# Patient Record
Sex: Male | Born: 1985 | Race: White | Hispanic: No | Marital: Married | Smoking: Never smoker
Health system: Southern US, Community
[De-identification: ages and names within clinical notes are randomized; demographics above are authoritative.]

## PROBLEM LIST (undated history)

## (undated) DIAGNOSIS — H9319 Tinnitus, unspecified ear: Secondary | ICD-10-CM

## (undated) HISTORY — DX: Tinnitus, unspecified ear: H93.19

---

## 2017-03-26 ENCOUNTER — Ambulatory Visit: Payer: Self-pay | Admitting: Physician Assistant

## 2017-04-04 ENCOUNTER — Ambulatory Visit: Payer: Self-pay | Admitting: Physician Assistant

## 2017-09-24 ENCOUNTER — Ambulatory Visit: Payer: BC Managed Care – PPO | Admitting: Physician Assistant

## 2017-09-24 ENCOUNTER — Other Ambulatory Visit: Payer: Self-pay

## 2017-09-24 ENCOUNTER — Ambulatory Visit (INDEPENDENT_AMBULATORY_CARE_PROVIDER_SITE_OTHER): Payer: BC Managed Care – PPO

## 2017-09-24 ENCOUNTER — Encounter: Payer: Self-pay | Admitting: Physician Assistant

## 2017-09-24 VITALS — BP 116/72 | HR 79 | Temp 97.7°F | Resp 16 | Ht 68.0 in | Wt 213.0 lb

## 2017-09-24 DIAGNOSIS — M25562 Pain in left knee: Secondary | ICD-10-CM

## 2017-09-24 DIAGNOSIS — G8929 Other chronic pain: Secondary | ICD-10-CM

## 2017-09-24 DIAGNOSIS — Z8739 Personal history of other diseases of the musculoskeletal system and connective tissue: Secondary | ICD-10-CM | POA: Diagnosis not present

## 2017-09-24 NOTE — Progress Notes (Signed)
Patient presents to clinic today to establish care.  Acute Concerns: Patient endorses chronic pain of L knee. Endorses having an injury to the L knee in [place] causing pain and discomfort. During assessment, patient endorses diagnosis of osgood schlatter. Notes intermittent pain in the anterior knee since this time. Denies redness, swelling, weakness. Denies instability with walking. Does occasionally note some left hip pain with prolonged standing. Has started over the past few months with intermittent aching pain of R knee. Denies swelling of extremity. Denies known trauma or injury to the R knee. 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 Does try to wear supportive footwear.  Health Maintenance: Immunizations -- Declines flu shot. Tetanus up-to-date. Due in 2024.  Past Medical History:  Diagnosis Date  . Ringing in ears    Both ears, started in 2015    History reviewed. No pertinent surgical history.  Current Outpatient Medications on File Prior to Visit  Medication Sig Dispense Refill  . MULTIPLE VITAMIN PO Take 1 tablet by mouth daily.    . Omega-3 Fatty Acids (FISH OIL) 1000 MG CAPS Take 1 capsule by mouth daily.     No current facility-administered medications on file prior to visit.     No Known Allergies  Family History  Problem Relation Age of Onset  . High blood pressure Mother   . High blood pressure Father   . Heart disease Paternal Uncle   . Diabetes Paternal Grandmother     Social History   Socioeconomic History  . Marital status: Married    Spouse name: Not on file  . Number of children: Not on file  . Years of education: Not on file  . Highest education level: Not on file  Social Needs  . Financial resource strain: Not on file  . Food insecurity - worry: Not on file  . Food insecurity - inability: Not on file  . Transportation needs - medical: Not on file  . Transportation needs - non-medical:  Not on file  Occupational History  . Not on file  Tobacco Use  . Smoking status: Never Smoker  . Smokeless tobacco: Never Used  Substance and Sexual Activity  . Alcohol use: Yes    Comment: Maybe about 3 beers per month  . Drug use: No  . Sexual activity: Yes    Birth control/protection: None  Other Topics Concern  . Not on file  Social History Narrative  . Not on file   Review of Systems  HENT: Positive for tinnitus (chronic). Negative for ear pain and hearing loss.   Eyes: Negative for blurred vision.  Cardiovascular: Negative for chest pain and palpitations.  Musculoskeletal: Positive for joint pain. Negative for back pain, falls, myalgias and neck pain.  Neurological: Negative for dizziness and loss of consciousness.  Psychiatric/Behavioral: Negative for depression. The patient is not nervous/anxious.    BP 116/72   Pulse 79   Temp 97.7 F (36.5 C) (Oral)   Resp 16   Ht 5\' 8"  (1.727 m)   Wt 213 lb (96.6 kg)   SpO2 97%   BMI 32.39 kg/m   Physical Exam  Constitutional: He is oriented to person, place, and time and well-developed, well-nourished, and in no distress.  HENT:  Head: Normocephalic and atraumatic.  Eyes: Conjunctivae are normal.  Neck: Neck supple.  Cardiovascular: Normal rate, regular rhythm, normal heart sounds and intact distal pulses.  Pulmonary/Chest: Effort normal  and breath sounds normal. No respiratory distress. He has no wheezes. He has no rales. He exhibits no tenderness.  Musculoskeletal:       Right knee: Normal. He exhibits normal range of motion, no swelling, no LCL laxity, normal patellar mobility, no bony tenderness and no MCL laxity. No tenderness found.       Left knee: He exhibits normal range of motion, no swelling, no LCL laxity, normal patellar mobility and no MCL laxity. Tenderness (tenderness over the tibial tuberosity noted) found. No medial joint line, no lateral joint line, no MCL, no LCL and no patellar tendon tenderness noted.    Neurological: He is alert and oriented to person, place, and time.  Skin: Skin is warm and dry. No rash noted.  Psychiatric: Affect normal.  Vitals reviewed.  Assessment/Plan: 1. Hx of Osgood-Schlatter disease 2. Chronic pain of left knee Tenderness of tibial tuberosity of L knee on exam. No noted swelling. No other abnormal exam findings. Will obtain imaging today. Discussed likely need for Ortho consult giving symptoms and history. Also worried that posture and weight-bearing on other joints to help with chronic L knee pain is causing discomfort in other joints.  - DG Knee Complete 4 Views Left; Future   Leeanne Rio, PA-C

## 2017-09-24 NOTE — Patient Instructions (Signed)
It was a pleasure meeting you today. Please go to the [facility] office for x-ray. I will call with results.   Please start a daily turmeric supplement (500 mg daily). Wear supportive footwear. I feel the R knee pain and occasional hip pain are due to postural changes to accommodate for the L knee.   Tylenol if needed for breakthrough pain. We may need Ortho or Physical therapy on board depending on x-ray findings.   Please schedule a complete physical at your earliest convenience.

## 2017-09-25 ENCOUNTER — Other Ambulatory Visit: Payer: Self-pay | Admitting: *Deleted

## 2017-09-25 DIAGNOSIS — G8929 Other chronic pain: Secondary | ICD-10-CM | POA: Insufficient documentation

## 2017-09-25 DIAGNOSIS — Z8739 Personal history of other diseases of the musculoskeletal system and connective tissue: Secondary | ICD-10-CM | POA: Insufficient documentation

## 2017-09-25 DIAGNOSIS — M25562 Pain in left knee: Secondary | ICD-10-CM

## 2017-11-10 ENCOUNTER — Telehealth: Payer: Self-pay | Admitting: Physician Assistant

## 2017-11-10 NOTE — Telephone Encounter (Signed)
Copied from [place] [id number]. Topic: Quick Communication - See Telephone Encounter >> Nov 10, 2017  4:23 PM Boyd Kerbs wrote: CRM for notification.   Pt would like to speak to [facility] regarding treatment that the Ortho doctor is suggesting .   See Telephone encounter for: 11/10/17.

## 2017-11-11 NOTE — Telephone Encounter (Signed)
I would recommend that he follow the orthopedics recommendations as they are the expert in this case.

## 2017-11-11 NOTE — Telephone Encounter (Signed)
Please advise from specialist records.

## 2017-11-14 NOTE — Telephone Encounter (Signed)
Advised patient of PCP recommendations. Patient was very hesitant of starting Ortho recommendations. They wanted him to take Ibuprofen 800 mg tid and use Pennsiad topical. Advised patient he can start the Ibuprofen as needed to help with the knee pain and follow up with them in 1 month. He was agreeable with starting as needed.

## 2017-11-18 ENCOUNTER — Ambulatory Visit (HOSPITAL_COMMUNITY)
Admission: EM | Admit: 2017-11-18 | Discharge: 2017-11-18 | Disposition: A | Discharge status: Home / Self Care | Payer: BC Managed Care – PPO | Attending: Family Medicine | Admitting: Family Medicine

## 2017-11-18 ENCOUNTER — Encounter (HOSPITAL_COMMUNITY): Payer: Self-pay | Admitting: Emergency Medicine

## 2017-11-18 DIAGNOSIS — J029 Acute pharyngitis, unspecified: Secondary | ICD-10-CM | POA: Insufficient documentation

## 2017-11-18 LAB — POCT RAPID STREP A: STREPTOCOCCUS, GROUP A SCREEN (DIRECT): NEGATIVE

## 2017-11-18 MED ORDER — CETIRIZINE HCL 10 MG PO TABS: 10.0000 mg | ORAL_TABLET | Freq: Every day | ORAL | 0 refills | Status: DC

## 2017-11-18 MED ORDER — TRIAMCINOLONE ACETONIDE 55 MCG/ACT NA AERO: 2.0000 | INHALATION_SPRAY | Freq: Every day | NASAL | 12 refills | Status: DC

## 2017-11-18 MED ORDER — LIDOCAINE VISCOUS 2 % MT SOLN: 15.0000 mL | OROMUCOSAL | 0 refills | Status: DC | PRN

## 2017-11-18 NOTE — ED Triage Notes (Signed)
Pt here for sore throat and painful swallowing

## 2017-11-18 NOTE — ED Provider Notes (Signed)
[place]    CSN: [id number] Arrival date & time: 11/18/17  1000     History   Chief Complaint Chief Complaint  Patient presents with  . Sore Throat    HPI Jesse Shaw is a 32 y.o. male.   32 year old male comes in for a 1 day history of sore throat.  Has also had some nasal congestion, mild cough.  Denies rhinorrhea.  Denies watery eyes, sneezing.  Denies fever, chills, night sweats.  Has been increasing fluid intake to help with sore throat.  States painful swallowing, but denies swelling of the throat, trouble breathing, tripoding, drooling.  Never smoker.     Past Medical History:  Diagnosis Date  . Ringing in ears    Both ears, started in 2015    Patient Active Problem List   Diagnosis Date Noted  . Hx of Osgood-Schlatter disease 09/25/2017  . Chronic pain of left knee 09/25/2017    History reviewed. No pertinent surgical history.     Home Medications    Prior to Admission medications   Medication Sig Start Date End Date Taking? Authorizing Provider  cetirizine (ZYRTEC) 10 MG tablet Take 1 tablet (10 mg total) by mouth daily. 11/18/17   Tasia Catchings, Tyja Gortney V, PA-C  lidocaine (XYLOCAINE) 2 % solution Use as directed 15 mLs in the mouth or throat as needed for mouth pain. 11/18/17   Tasia Catchings, Xaidyn Kepner V, PA-C  MULTIPLE VITAMIN PO Take 1 tablet by mouth daily.    [provider]  Omega-3 Fatty Acids (FISH OIL) 1000 MG CAPS Take 1 capsule by mouth daily.    [provider]  triamcinolone (NASACORT) 55 MCG/ACT AERO nasal inhaler Place 2 sprays into the nose daily. 11/18/17   Ok Edwards, PA-C    Family History Family History  Problem Relation Age of Onset  . High blood pressure Mother   . High blood pressure Father   . Heart disease Paternal Uncle   . Diabetes Paternal Grandmother   . Breast cancer Maternal Grandmother     Social History Social History   Tobacco Use  . Smoking status: Never Smoker  . Smokeless tobacco: Never Used  Substance Use  Topics  . Alcohol use: Yes    Comment: Maybe about 3 beers per month  . Drug use: No     Allergies   Patient has no known allergies.   Review of Systems Review of Systems  Reason unable to perform ROS: See HPI as above.     Physical Exam Triage Vital Signs ED Triage Vitals [11/18/17 1019]  Enc Vitals Group     BP 133/64     Pulse Rate 71     Resp 16     Temp 98.1 F (36.7 C)     Temp Source Oral     SpO2 99 %     Weight      Height      Head Circumference      Peak Flow      Pain Score      Pain Loc      Pain Edu?      Excl. in [place]?    No data found.  Updated Vital Signs BP 133/64 (BP Location: Left Arm)   Pulse 71   Temp 98.1 F (36.7 C) (Oral)   Resp 16   SpO2 99%   Physical Exam  Constitutional: He is oriented to person, place, and time. He appears well-developed and well-nourished.  Non-toxic appearance. He  does not appear ill. No distress.  Speaking in full sentences without problems  HENT:  Head: Normocephalic and atraumatic.  Right Ear: Tympanic membrane, external ear and ear canal normal. Tympanic membrane is not erythematous and not bulging.  Left Ear: Tympanic membrane, external ear and ear canal normal. Tympanic membrane is not erythematous and not bulging.  Nose: Nose normal. Right sinus exhibits no maxillary sinus tenderness and no frontal sinus tenderness. Left sinus exhibits no maxillary sinus tenderness and no frontal sinus tenderness.  Mouth/Throat: Uvula is midline, oropharynx is clear and moist and mucous membranes are normal. Tonsils are 2+ on the right. Tonsils are 2+ on the left. No tonsillar exudate.  Eyes: Pupils are equal, round, and reactive to light. Conjunctivae are normal.  Neck: Normal range of motion. Neck supple.  Cardiovascular: Normal rate, regular rhythm and normal heart sounds. Exam reveals no gallop and no friction rub.  No murmur heard. Pulmonary/Chest: Effort normal and breath sounds normal. He has no decreased breath  sounds. He has no wheezes. He has no rhonchi. He has no rales.  Lymphadenopathy:    He has no cervical adenopathy.  Neurological: He is alert and oriented to person, place, and time.  Skin: Skin is warm and dry.  Psychiatric: He has a normal mood and affect. His behavior is normal. Judgment normal.     UC Treatments / Results  Labs (all labs ordered are listed, but only abnormal results are displayed) Labs Reviewed  CULTURE, GROUP A STREP [facility])  POCT RAPID STREP A    EKG None  Radiology No results found.  Procedures Procedures (including critical care time)  Medications Ordered in UC Medications - No data to display  Initial Impression / Assessment and Plan / UC Course  I have reviewed the triage vital signs and the nursing notes.  Pertinent labs & imaging results that were available during my care of the patient were reviewed by me and considered in my medical decision making (see chart for details).    Rapid strep negative. Patient is nontoxic in appearance. Symptomatic treatment as needed. Return precautions given.   Final Clinical Impressions(s) / UC Diagnoses   Final diagnoses:  Pharyngitis, unspecified etiology     Discharge Instructions     Rapid strep negative. Symptoms are most likely due to viral illness/drainage. Start lidocaine solution to gurgle as needed for sore throat, wait 30-40 mins to eat or drink after use as it can stunt your gag reflex. Nasacort and/or Zyrtec for nasal congestion/drainage. You can use over the counter nasal saline rinse such as neti pot for nasal congestion. Monitor for any worsening of symptoms, swelling of the throat, trouble breathing, trouble swallowing, follow up for reevaluation.   For sore throat try using a honey-based tea. Use 3 teaspoons of honey with juice squeezed from half lemon. Place shaved pieces of ginger into 1/2-1 cup of water and warm over stove top. Then mix the ingredients and repeat every 4 hours as  needed.     ED Prescriptions    Medication Sig Dispense Auth. Provider   triamcinolone (NASACORT) 55 MCG/ACT AERO nasal inhaler Place 2 sprays into the nose daily. 1 Inhaler Itzael Liptak V, PA-C   cetirizine (ZYRTEC) 10 MG tablet Take 1 tablet (10 mg total) by mouth daily. 15 tablet Hally Colella V, PA-C   lidocaine (XYLOCAINE) 2 % solution Use as directed 15 mLs in the mouth or throat as needed for mouth pain. 100 mL Ok Edwards, PA-C  Ok Edwards, PA-C 11/18/17 1102

## 2017-11-18 NOTE — Discharge Instructions (Signed)
Rapid strep negative. Symptoms are most likely due to viral illness/drainage. Start lidocaine solution to gurgle as needed for sore throat, wait 30-40 mins to eat or drink after use as it can stunt your gag reflex. Nasacort and/or Zyrtec for nasal congestion/drainage. You can use over the counter nasal saline rinse such as neti pot for nasal congestion. Monitor for any worsening of symptoms, swelling of the throat, trouble breathing, trouble swallowing, follow up for reevaluation.   For sore throat try using a honey-based tea. Use 3 teaspoons of honey with juice squeezed from half lemon. Place shaved pieces of ginger into 1/2-1 cup of water and warm over stove top. Then mix the ingredients and repeat every 4 hours as needed.

## 2017-11-20 LAB — CULTURE, GROUP A STREP (THRC)

## 2018-01-13 ENCOUNTER — Other Ambulatory Visit: Payer: Self-pay

## 2018-01-13 ENCOUNTER — Encounter: Payer: Self-pay | Admitting: Physician Assistant

## 2018-01-13 ENCOUNTER — Ambulatory Visit: Payer: BC Managed Care – PPO | Admitting: Physician Assistant

## 2018-01-13 VITALS — BP 110/70 | HR 76 | Temp 98.3°F | Resp 14 | Ht 68.0 in | Wt 208.0 lb

## 2018-01-13 DIAGNOSIS — S46812A Strain of other muscles, fascia and tendons at shoulder and upper arm level, left arm, initial encounter: Secondary | ICD-10-CM | POA: Diagnosis not present

## 2018-01-13 DIAGNOSIS — M5412 Radiculopathy, cervical region: Secondary | ICD-10-CM | POA: Diagnosis not present

## 2018-01-13 MED ORDER — METHYLPREDNISOLONE 4 MG PO TBPK: ORAL_TABLET | ORAL | 0 refills | Status: DC

## 2018-01-13 MED ORDER — CYCLOBENZAPRINE HCL 10 MG PO TABS: 10.0000 mg | ORAL_TABLET | Freq: Three times a day (TID) | ORAL | 0 refills | Status: DC | PRN

## 2018-01-13 NOTE — Progress Notes (Signed)
Patient presents to clinic today c/o pain in L shoulder/neck with radiation into LUE with some numbness and tingling. 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  Notes tightness in back and L shoulder with some mild weakness in his arm. Denies decreased ROM. Denies skin changes. Has taken Ibuprofen with little relief in symptoms.    Past Medical History:  Diagnosis Date  . Ringing in ears    Both ears, started in 2015    Current Outpatient Medications on File Prior to Visit  Medication Sig Dispense Refill  . MULTIPLE VITAMIN PO Take 1 tablet by mouth daily.    . Omega-3 Fatty Acids (FISH OIL) 1000 MG CAPS Take 1 capsule by mouth daily.    . Turmeric 400 MG CAPS Take 1 capsule by mouth daily.     No current facility-administered medications on file prior to visit.     No Known Allergies  Family History  Problem Relation Age of Onset  . High blood pressure Mother   . High blood pressure Father   . Heart disease Paternal Uncle   . Diabetes Paternal Grandmother   . Breast cancer Maternal Grandmother     Social History   Socioeconomic History  . Marital status: Married    Spouse name: Not on file  . Number of children: Not on file  . Years of education: Not on file  . Highest education level: Not on file  Occupational History  . Not on file  Social Needs  . Financial resource strain: Not on file  . Food insecurity:    Worry: Not on file    Inability: Not on file  . Transportation needs:    Medical: Not on file    Non-medical: Not on file  Tobacco Use  . Smoking status: Never Smoker  . Smokeless tobacco: Never Used  Substance and Sexual Activity  . Alcohol use: Yes    Comment: Maybe about 3 beers per month  . Drug use: No  . Sexual activity: Yes    Birth control/protection: None  Lifestyle  . Physical activity:    Days per week: Not on file    Minutes per session: Not on file  . Stress: Not on file  Relationships  . Social connections:    Talks on phone: Not on  file    Gets together: Not on file    Attends religious service: Not on file    Active member of club or organization: Not on file    Attends meetings of clubs or organizations: Not on file    Relationship status: Not on file  Other Topics Concern  . Not on file  Social History Narrative  . Not on file   Review of Systems - See HPI.  All other ROS are negative.  BP 110/70   Pulse 76   Temp 98.3 F (36.8 C) (Oral)   Resp 14   Ht 5\' 8"  (1.727 m)   Wt 208 lb (94.3 kg)   SpO2 98%   BMI 31.63 kg/m   Physical Exam  Constitutional: He appears well-developed and well-nourished.  Cardiovascular: Normal rate, regular rhythm and normal heart sounds.  Pulmonary/Chest: Effort normal and breath sounds normal.  Musculoskeletal:       Left shoulder: Normal.       Left elbow: Normal.       Cervical back: He exhibits tenderness, pain and spasm. He exhibits no bony tenderness.       Back:  Vitals reviewed.  Recent Results (  from the past 2160 hour(s))  POCT rapid strep A [facility])     Status: None   Collection Time: 11/18/17 10:29 AM  Result Value Ref Range   Streptococcus, Group A Screen (Direct) NEGATIVE NEGATIVE  Culture, group A strep     Status: None   Collection Time: 11/18/17 10:31 AM  Result Value Ref Range   Specimen Description THROAT    Special Requests NONE    Culture      NO GROUP A STREP (S.PYOGENES) ISOLATED Performed at [facility], [address], [place], [place] [address]    Report Status 11/20/2017 FINAL    Assessment/Plan: 1. Strain of left trapezius muscle, initial encounter Start Flexeril in the evening. Limit heavy lifting and over exertion. Start Medrol dose pack. Follow-up if not resolving.  - cyclobenzaprine (FLEXERIL) 10 MG tablet; Take 1 tablet (10 mg total) by mouth 3 (three) times daily as needed for muscle spasms.  Dispense: 30 tablet; Refill: 0  2. Cervical radicular pain Start medrol dose pack to calm nerve inflammation. Supportive  measures and OTC medications reviewed.  - methylPREDNISolone (MEDROL DOSEPAK) 4 MG TBPK tablet; Take following package directions.  Dispense: 21 tablet; Refill: 0   Leeanne Rio, PA-C

## 2018-01-13 NOTE — Patient Instructions (Signed)
Please keep well-hydrated and get plenty of rest. No jujitsu until symptoms are resolved.  Can to light stretches.  No heavy lifting or overhead lifting with the left shoulder.  Apply heating pad to the neck and trapezius for 10 minutes a few times per day.  Take the Medrol pack as directed. Use tylenol for back pain. Use the Flexeril in the evening.  Follow-up if symptoms are not resolving.

## 2018-03-16 ENCOUNTER — Encounter: Payer: Self-pay | Admitting: Physician Assistant

## 2018-03-16 ENCOUNTER — Other Ambulatory Visit: Payer: Self-pay

## 2018-03-16 ENCOUNTER — Ambulatory Visit (INDEPENDENT_AMBULATORY_CARE_PROVIDER_SITE_OTHER): Payer: BC Managed Care – PPO | Admitting: Physician Assistant

## 2018-03-16 VITALS — BP 118/84 | HR 65 | Temp 98.4°F | Resp 16 | Ht 68.0 in | Wt 206.4 lb

## 2018-03-16 DIAGNOSIS — D229 Melanocytic nevi, unspecified: Secondary | ICD-10-CM

## 2018-03-16 NOTE — Progress Notes (Signed)
Patient presents to clinic today c/o numerous moles of skin that he has had since a young age. Notes his wife wanted them assessed to make sure there are no concerning findings.    Past Medical History:  Diagnosis Date  . Ringing in ears    Both ears, started in 2015    Current Outpatient Medications on File Prior to Visit  Medication Sig Dispense Refill  . MULTIPLE VITAMIN PO Take 1 tablet by mouth daily.    . Omega-3 Fatty Acids (FISH OIL) 1000 MG CAPS Take 1 capsule by mouth daily.    Marland Kitchen triamcinolone (NASACORT) 55 MCG/ACT AERO nasal inhaler triamcinolone acetonide 55 mcg nasal spray aerosol  PLACE 2 SPRAYS INTO THE NOSE DAILY.    . Turmeric 400 MG CAPS Take 1 capsule by mouth daily.    . cyclobenzaprine (FLEXERIL) 10 MG tablet Take 1 tablet (10 mg total) by mouth 3 (three) times daily as needed for muscle spasms. (Patient not taking: Reported on 03/16/2018) 30 tablet 0  . Diclofenac Sodium (PENNSAID) 2 % SOLN Pennsaid 20 mg/gram/actuation (2 %) topical soln in metered-dose pump  APPLY 2 PUMPS (40 MG) TO THE AFFECTED KNEE(S) BY TOPICAL ROUTE 2 TIMES PER DAY    . lidocaine (XYLOCAINE) 2 % solution Lidocaine Viscous 2 % mucosal solution  USE AS DIRECTED 15 MLS IN THE MOUTH OR THROAT AS NEEDED FOR MOUTH PAIN.     No current facility-administered medications on file prior to visit.     No Known Allergies  Family History  Problem Relation Age of Onset  . High blood pressure Mother   . High blood pressure Father   . Heart disease Paternal Uncle   . Diabetes Paternal Grandmother   . Breast cancer Maternal Grandmother     Social History   Socioeconomic History  . Marital status: Married    Spouse name: Not on file  . Number of children: Not on file  . Years of education: Not on file  . Highest education level: Not on file  Occupational History  . Not on file  Social Needs  . Financial resource strain: Not on file  . Food insecurity:    Worry: Not on file    Inability:  Not on file  . Transportation needs:    Medical: Not on file    Non-medical: Not on file  Tobacco Use  . Smoking status: Never Smoker  . Smokeless tobacco: Never Used  Substance and Sexual Activity  . Alcohol use: Yes    Comment: Maybe about 3 beers per month  . Drug use: No  . Sexual activity: Yes    Birth control/protection: None  Lifestyle  . Physical activity:    Days per week: Not on file    Minutes per session: Not on file  . Stress: Not on file  Relationships  . Social connections:    Talks on phone: Not on file    Gets together: Not on file    Attends religious service: Not on file    Active member of club or organization: Not on file    Attends meetings of clubs or organizations: Not on file    Relationship status: Not on file  Other Topics Concern  . Not on file  Social History Narrative  . Not on file   Review of Systems - See HPI.  All other ROS are negative.  Ht 5\' 8"  (1.727 m)   BMI 31.63 kg/m   Physical Exam  Constitutional:  He is oriented to person, place, and time. He appears well-developed and well-nourished.  Cardiovascular: Normal rate, regular rhythm, normal heart sounds and intact distal pulses.  Pulmonary/Chest: Effort normal.  Neurological: He is alert and oriented to person, place, and time.  Skin:     Psychiatric: He has a normal mood and affect.  Vitals reviewed.   Assessment/Plan: 1. Multiple nevi Referral to dermatology placed for further assessment and management giving multiple nevi and 1 with some atypical characteristics. Skin care discussed.   - Ambulatory referral to Dermatology   Leeanne Rio, PA-C

## 2018-03-16 NOTE — Patient Instructions (Signed)
I am placing a referral for you to Dermatology -- Dr. Nevada Crane in [place].  Make sure to always wear sunscreen -- applying to face, neck and ears -- when you are going to be outside for > 20 minutes.  Wear a hat when possible. 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   Nothing looks overly concerning today which is good. We will have the Dermatologist do a head to toe skin examination to verify.

## 2018-09-04 ENCOUNTER — Ambulatory Visit: Payer: BC Managed Care – PPO | Admitting: Physician Assistant

## 2018-09-07 ENCOUNTER — Ambulatory Visit: Payer: BC Managed Care – PPO | Admitting: Physician Assistant

## 2018-11-16 ENCOUNTER — Encounter: Payer: Self-pay | Admitting: Emergency Medicine

## 2018-11-23 ENCOUNTER — Encounter: Payer: Self-pay | Admitting: Physician Assistant

## 2018-11-23 ENCOUNTER — Ambulatory Visit (INDEPENDENT_AMBULATORY_CARE_PROVIDER_SITE_OTHER): Payer: BC Managed Care – PPO | Admitting: Physician Assistant

## 2018-11-23 ENCOUNTER — Other Ambulatory Visit: Payer: Self-pay

## 2018-11-23 ENCOUNTER — Ambulatory Visit: Payer: Self-pay | Admitting: Physician Assistant

## 2018-11-23 VITALS — Temp 98.3°F | Ht 68.0 in | Wt 208.0 lb

## 2018-11-23 DIAGNOSIS — Z20822 Contact with and (suspected) exposure to covid-19: Secondary | ICD-10-CM

## 2018-11-23 DIAGNOSIS — R6889 Other general symptoms and signs: Secondary | ICD-10-CM | POA: Diagnosis not present

## 2018-11-23 MED ORDER — ALBUTEROL SULFATE HFA 108 (90 BASE) MCG/ACT IN AERS
2.0000 | INHALATION_SPRAY | Freq: Four times a day (QID) | RESPIRATORY_TRACT | 0 refills | Status: DC | PRN
Start: 2018-11-23 — End: 2019-07-22

## 2018-11-23 NOTE — Patient Instructions (Signed)
Instructions sent to MyChart.   Please keep well-hydrated and get plenty of rest.  Delsym or Mucinex for any significant cough. Use the albuterol inhaler as directed if needed for chest tightness, shortness of breath. I have sent a stay at home note to your MyChart. Check with your work about their capability of testing. Go to the ER for any worsening of symptoms.   Individuals who are confirmed to have, or are being evaluated for, COVID-19 should follow the prevention steps below until a healthcare provider or local or state health department says they can return to normal activities.  Stay home except to get medical care You should restrict activities outside your home, except for getting medical care. Do not go to work, school, or public areas, and do not use public transportation or taxis.  Call ahead before visiting your doctor Before your medical appointment, call the healthcare provider and tell them that you have, or are being evaluated for, COVID-19 infection. This will help the healthcare provider's office take steps to keep other people from getting infected. Ask your healthcare provider to call the local or state health department.  Monitor your symptoms Seek prompt medical attention if your illness is worsening (e.g., difficulty breathing). Before going to your medical appointment, call the healthcare provider and tell them that you have, or are being evaluated for, COVID-19 infection. Ask your healthcare provider to call the local or state health department.  Wear a facemask You should wear a facemask that covers your nose and mouth when you are in the same room with other people and when you visit a healthcare provider. People who live with or visit you should also wear a facemask while they are in the same room with you.  Separate yourself from other people in your home As much as possible, you should stay in a different room from other people in your home. Also, you  should use a separate bathroom, if available.  Avoid sharing household items You should not share dishes, drinking glasses, cups, eating utensils, towels, bedding, or other items with other people in your home. After using these items, you should wash them thoroughly with soap and water.  Cover your coughs and sneezes Cover your mouth and nose with a tissue when you cough or sneeze, or you can cough or sneeze into your sleeve. Throw used tissues in a lined trash can, and immediately wash your hands with soap and water for at least 20 seconds or use an alcohol-based hand rub.  Wash your [facility] your hands often and thoroughly with soap and water for at least 20 seconds. You can use an alcohol-based hand sanitizer if soap and water are not available and if your hands are not visibly dirty. Avoid touching your eyes, nose, and mouth with unwashed hands.   Prevention Steps for Caregivers and Household Members of Individuals Confirmed to have, or Being Evaluated for, COVID-19 Infection Being Cared for in the Home  If you live with, or provide care at home for, a person confirmed to have, or being evaluated for, COVID-19 infection please follow these guidelines to prevent infection:  Follow healthcare provider's instructions Make sure that you understand and can help the patient follow any healthcare provider instructions for all care.  Provide for the patient's basic needs You should help the patient with basic needs in the home and provide support for getting groceries, prescriptions, and other personal needs.  Monitor the patient's symptoms If they are getting sicker, call his  or her medical provider and tell them that the patient has, or is being evaluated for, COVID-19 infection. This will help the healthcare provider's office take steps to keep other people from getting infected. Ask the healthcare provider to call the local or state health department.  Limit the number of  people who have contact with the patient  If possible, have only one caregiver for the patient.  Other household members should stay in another home or place of residence. If this is not possible, they should stay  in another room, or be separated from the patient as much as possible. Use a separate bathroom, if available.  Restrict visitors who do not have an essential need to be in the home.  Keep older adults, very young children, and other sick people away from the patient Keep older adults, very young children, and those who have compromised immune systems or chronic health conditions away from the patient. This includes people with chronic heart, lung, or kidney conditions, diabetes, and cancer.  Ensure good ventilation Make sure that shared spaces in the home have good air flow, such as from an air conditioner or an opened window, weather permitting.  Wash your hands often  Wash your hands often and thoroughly with soap and water for at least 20 seconds. You can use an alcohol based hand sanitizer if soap and water are not available and if your hands are not visibly dirty.  Avoid touching your eyes, nose, and mouth with unwashed hands.  Use disposable paper towels to dry your hands. If not available, use dedicated cloth towels and replace them when they become wet.  Wear a facemask and gloves  Wear a disposable facemask at all times in the room and gloves when you touch or have contact with the patient's blood, body fluids, and/or secretions or excretions, such as sweat, saliva, sputum, nasal mucus, vomit, urine, or feces.  Ensure the mask fits over your nose and mouth tightly, and do not touch it during use.  Throw out disposable facemasks and gloves after using them. Do not reuse.  Wash your hands immediately after removing your facemask and gloves.  If your personal clothing becomes contaminated, carefully remove clothing and launder. Wash your hands after handling  contaminated clothing.  Place all used disposable facemasks, gloves, and other waste in a lined container before disposing them with other household waste.  Remove gloves and wash your hands immediately after handling these items.  Do not share dishes, glasses, or other household items with the patient  Avoid sharing household items. You should not share dishes, drinking glasses, cups, eating utensils, towels, bedding, or other items with a patient who is confirmed to have, or being evaluated for, COVID-19 infection.  After the person uses these items, you should wash them thoroughly with soap and water.  Wash laundry thoroughly  Immediately remove and wash clothes or bedding that have blood, body fluids, and/or secretions or excretions, such as sweat, saliva, sputum, nasal mucus, vomit, urine, or feces, on them.  Wear gloves when handling laundry from the patient.  Read and follow directions on labels of laundry or clothing items and detergent. In general, wash and dry with the warmest temperatures recommended on the label.  Clean all areas the individual has used often  Clean all touchable surfaces, such as counters, tabletops, doorknobs, bathroom fixtures, toilets, phones, keyboards, tablets, and bedside tables, every day. Also, clean any surfaces that may have blood, body fluids, and/or secretions or excretions on them.  Wear gloves when cleaning surfaces the patient has come in contact with.  Use a diluted bleach solution (e.g., dilute bleach with 1 part bleach and 10 parts water) or a household disinfectant with a label that says EPA-registered for coronaviruses. To make a bleach solution at home, add 1 tablespoon of bleach to 1 quart (4 cups) of water. For a larger supply, add  cup of bleach to 1 gallon (16 cups) of water.  Read labels of cleaning products and follow recommendations provided on product labels. Labels contain instructions for safe and effective use of the cleaning  product including precautions you should take when applying the product, such as wearing gloves or eye protection and making sure you have good ventilation during use of the product.  Remove gloves and wash hands immediately after cleaning.  Monitor yourself for signs and symptoms of illness Caregivers and household members are considered close contacts, should monitor their health, and will be asked to limit movement outside of the home to the extent possible. Follow the monitoring steps for close contacts listed on the symptom monitoring form.   ? If you have additional questions, contact your local health department or call the epidemiologist on call at [phone] (available 24/7). ? This guidance is subject to change. For the most up-to-date guidance from [facility], please refer to their website: [email or web address]

## 2018-11-23 NOTE — Progress Notes (Signed)
I have discussed the procedure for the virtual visit with the patient who has given consent to proceed with assessment and treatment.   Linsie Lupo S Yvone Slape, CMA     

## 2018-11-23 NOTE — Telephone Encounter (Signed)
Virtual Appointment has been scheduled with PCP today.

## 2018-11-23 NOTE — Progress Notes (Signed)
   Virtual Visit via Video   I connected with patient on 11/23/18 at 10:40 AM EDT by a video enabled telemedicine application and verified that I am speaking with the correct person using two identifiers.  Location patient: Home Location provider: [facility] Office Persons participating in the virtual visit: Patient, Provider, [place] (Patina Moore)  I discussed the limitations of evaluation and management by telemedicine and the availability of in person appointments. The patient expressed understanding and agreed to proceed.  Subjective:   HPI:   Patient presents via [employer] today for potential COVID19. Patient endorses symptoms starting 5 days ago.  Endorses mild, dry cough with shortness of breath.  Notes shortness of breath has worsened since onset.  Denies wheezing but endorses feeling that he is not getting enough air.  Does note fatigue.  Unsure of fever.  Denies aches or chills.  Notes some nasal congestion without sinus pressure or sinus pain.  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   ROS:   See pertinent positives and negatives per HPI.  Patient Active Problem List   Diagnosis Date Noted  . Hx of Osgood-Schlatter disease 09/25/2017  . Chronic pain of left knee 09/25/2017    Social History   Tobacco Use  . Smoking status: Never Smoker  . Smokeless tobacco: Never Used  Substance Use Topics  . Alcohol use: Yes    Comment: Maybe about 3 beers per month    Current Outpatient Medications:  Marland Kitchen  MULTIPLE VITAMIN PO, Take 1 tablet by mouth daily., Disp: , Rfl:  .  Omega-3 Fatty Acids (FISH OIL) 1000 MG CAPS, Take 1 capsule by mouth daily., Disp: , Rfl:  .  Diclofenac Sodium (PENNSAID) 2 % SOLN, Pennsaid 20 mg/gram/actuation (2 %) topical soln in metered-dose pump  APPLY 2 PUMPS (40 MG) TO THE AFFECTED KNEE(S) BY TOPICAL ROUTE 2 TIMES PER DAY, Disp: , Rfl:  .  triamcinolone (NASACORT) 55 MCG/ACT AERO  nasal inhaler, triamcinolone acetonide 55 mcg nasal spray aerosol  PLACE 2 SPRAYS INTO THE NOSE DAILY., Disp: , Rfl:  .  Turmeric 400 MG CAPS, Take 1 capsule by mouth daily., Disp: , Rfl:   No Known Allergies  Objective:   Temp 98.3 F (36.8 C) (Oral)   Ht 5\' 8"  (1.727 m)   Wt 208 lb (94.3 kg)   BMI 31.63 kg/m   Patient is well-developed, well-nourished in no acute distress.  Resting comfortably in chair at home.  Head is normocephalic, atraumatic.  No labored breathing.  Speech is clear and coherent with logical contest.  Patient is alert and oriented at baseline.   Assessment and Plan:   1. Suspected Covid-19 Virus Infection Mild symptoms. No alarm signs/symptoms. Supportive measures and OTC medications reviewed. Rx Albuterol HFA to help with chest tightness and windedness. Isolation precautions reviewed. ER precautions reviewed. Patient placed in our COVID/temp monitoring program.  - albuterol (VENTOLIN HFA) 108 (90 Base) MCG/ACT inhaler; Inhale 2 puffs into the lungs every 6 (six) hours as needed for wheezing or shortness of breath.  Dispense: 1 Inhaler; Refill: 0 - MyChart COVID-19 home monitoring program; Future - Temperature monitoring; Future   Leeanne Rio, PA-C 11/23/2018

## 2018-11-23 NOTE — Telephone Encounter (Signed)
I wanted to know if I could be tested for the COVID-19 virus.   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  See triage notes. He denies any chest pain/discomfort with the shortness of breath.  I connected his call to Levada Dy at the [facility] office to be scheduled for a virtual visit.  I sent my notes to the office.    Reason for Disposition . [1] MILD difficulty breathing (e.g., minimal/no SOB at rest, SOB with walking, pulse <100) AND [2] NEW-onset or WORSE than normal  Answer Assessment - Initial Assessment Questions 1. RESPIRATORY STATUS: "Describe your breathing?" (e.g., wheezing, shortness of breath, unable to speak, severe coughing)      I'm feeling winded.   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   This weekend I'm noticing the shortness of breath is worse.   That's not like me. 2. ONSET: "When did this breathing problem begin?"      Wednesday of last week. 3. PATTERN "Does the difficult breathing come and go, or has it been constant since it started?"      Mostly with activity or wearing a mask. 4. SEVERITY: "How bad is your breathing?" (e.g., mild, moderate, severe)    - MILD: No SOB at rest, mild SOB with walking, speaks normally in sentences, can lay down, no retractions, pulse < 100.    - MODERATE: SOB at rest, SOB with minimal exertion and prefers to sit, cannot lie down flat, speaks in phrases, mild retractions, audible wheezing, pulse 100-120.    - SEVERE: Very SOB at rest, speaks in single words, struggling to breathe, sitting hunched forward, retractions, pulse > 120      If I'm going up the stairs I notice it more.   It's not so bad when I'm sitting. 5. RECURRENT SYMPTOM: "Have you had difficulty breathing before?" If so, ask: "When was the last time?" and  "What happened that time?"      No 6. CARDIAC HISTORY: "Do you have any history of heart disease?" (e.g., heart attack, angina, bypass surgery, angioplasty)      No 7. LUNG HISTORY: "Do you have any history of lung disease?"  (e.g., pulmonary embolus, asthma, emphysema)     No 8. CAUSE: "What do you think is causing the breathing problem?"      I've been exposed to the coronavirus. 9. OTHER SYMPTOMS: "Do you have any other symptoms? (e.g., dizziness, runny nose, cough, chest pain, fever)     No fever, a little coughing but it's very little, no chills or body aches, no change in taste or smell.  My throat is achy started about Wednesday also.   Fatigue has not been a problem mostly the shortness of breath.   Having nasal congestion. 10. PREGNANCY: "Is there any chance you are pregnant?" "When was your last menstrual period?"       N/A 11. TRAVEL: "Have you traveled out of the country in the last month?" (e.g., travel history, exposures)       *No Answer*  Protocols used: BREATHING DIFFICULTY-A-AH

## 2018-11-24 ENCOUNTER — Encounter (INDEPENDENT_AMBULATORY_CARE_PROVIDER_SITE_OTHER): Payer: Self-pay

## 2018-11-24 ENCOUNTER — Telehealth: Payer: Self-pay | Admitting: *Deleted

## 2018-11-24 NOTE — Telephone Encounter (Signed)
FYI

## 2018-11-24 NOTE — Telephone Encounter (Signed)
Patient called after receiving BPA for new onset diarrhea via MyChart Covid-19 conditioning monitoring. Patient stated that he actually had diarrhea on yesterday but has not experienced any today. Pt states that on yesterday he had 3 episodes of diarrhea. Pt states he has been able to tolerate fluids and food. Pt encouraged to continue to to drink fluids and to eat bland foods. Pt advised to avoid alcohol, spicy foods, caffeine or fatty foods that could make diarrhea worse. Pt advised that if diarrhea returns he could try OTC medications. Pt advised to notify PCP with worsening symptoms. Pt verbalized and understanding. No additional concerns voiced at this time.

## 2018-11-24 NOTE — Telephone Encounter (Signed)
Noted! Thank you

## 2018-11-25 ENCOUNTER — Encounter (INDEPENDENT_AMBULATORY_CARE_PROVIDER_SITE_OTHER): Payer: Self-pay

## 2018-11-26 ENCOUNTER — Encounter (INDEPENDENT_AMBULATORY_CARE_PROVIDER_SITE_OTHER): Payer: Self-pay

## 2018-11-29 ENCOUNTER — Encounter (INDEPENDENT_AMBULATORY_CARE_PROVIDER_SITE_OTHER): Payer: Self-pay

## 2018-12-01 ENCOUNTER — Encounter: Payer: Self-pay | Admitting: Physician Assistant

## 2018-12-01 ENCOUNTER — Encounter (INDEPENDENT_AMBULATORY_CARE_PROVIDER_SITE_OTHER): Payer: Self-pay

## 2019-02-21 IMAGING — DX DG KNEE COMPLETE 4+V*L*
4 series · 4 of 4 positions shown · non-contrast
Comparison: None.

CLINICAL DATA: Chronic left knee pain. Worsening chronic left knee
pain. History of Osgood-Schlatter.

EXAM:
LEFT KNEE - COMPLETE 4+ VIEW

[knee standing ap]
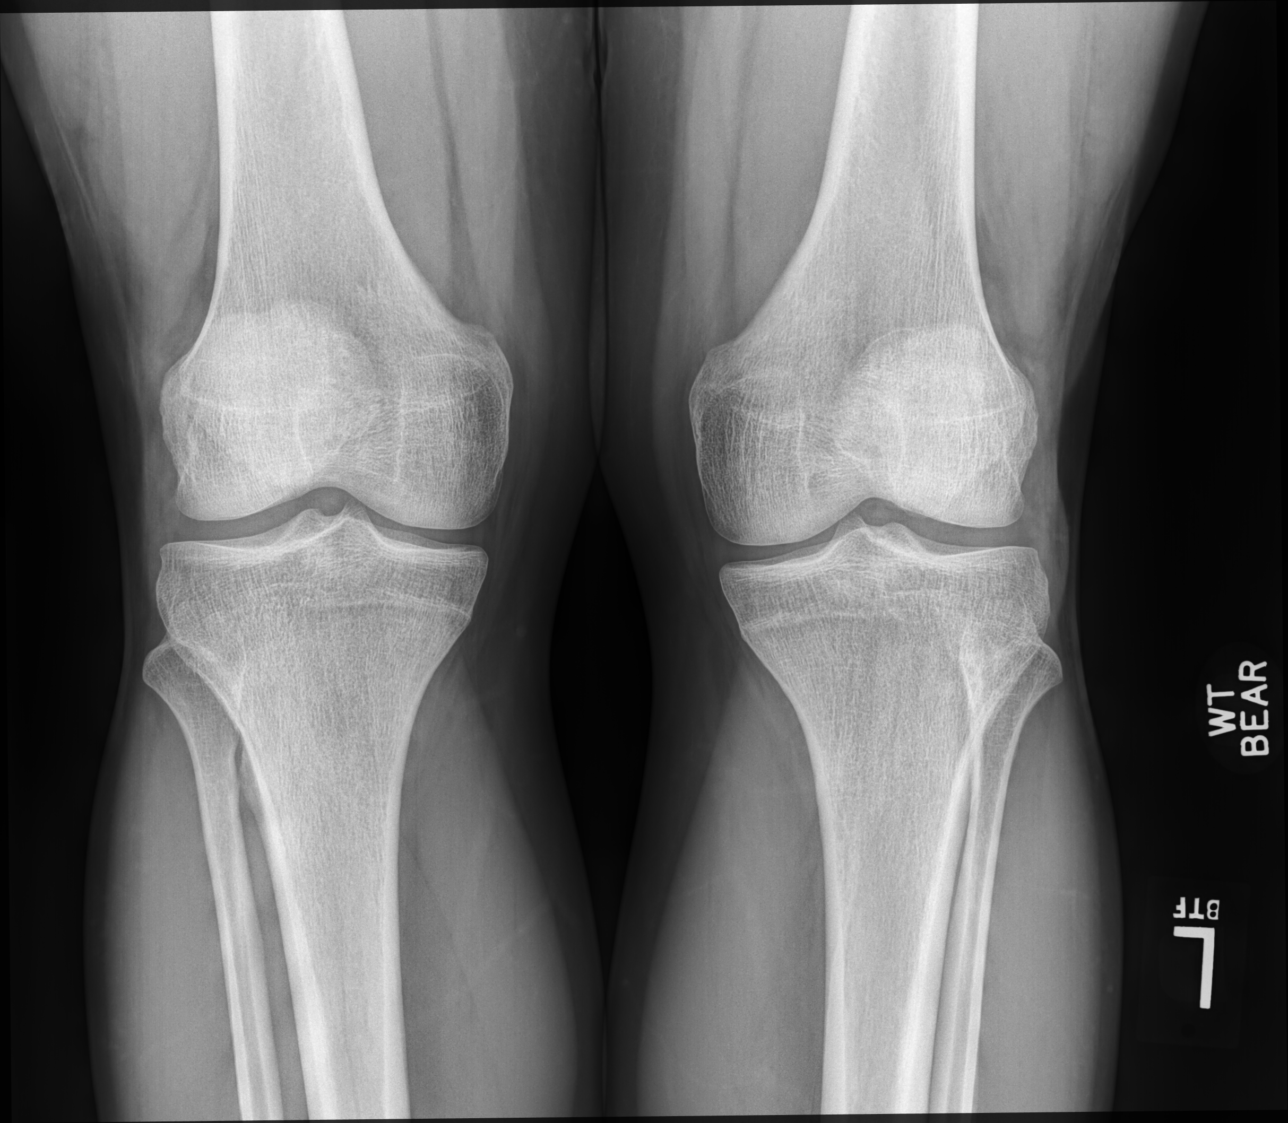

[knee standing lat]
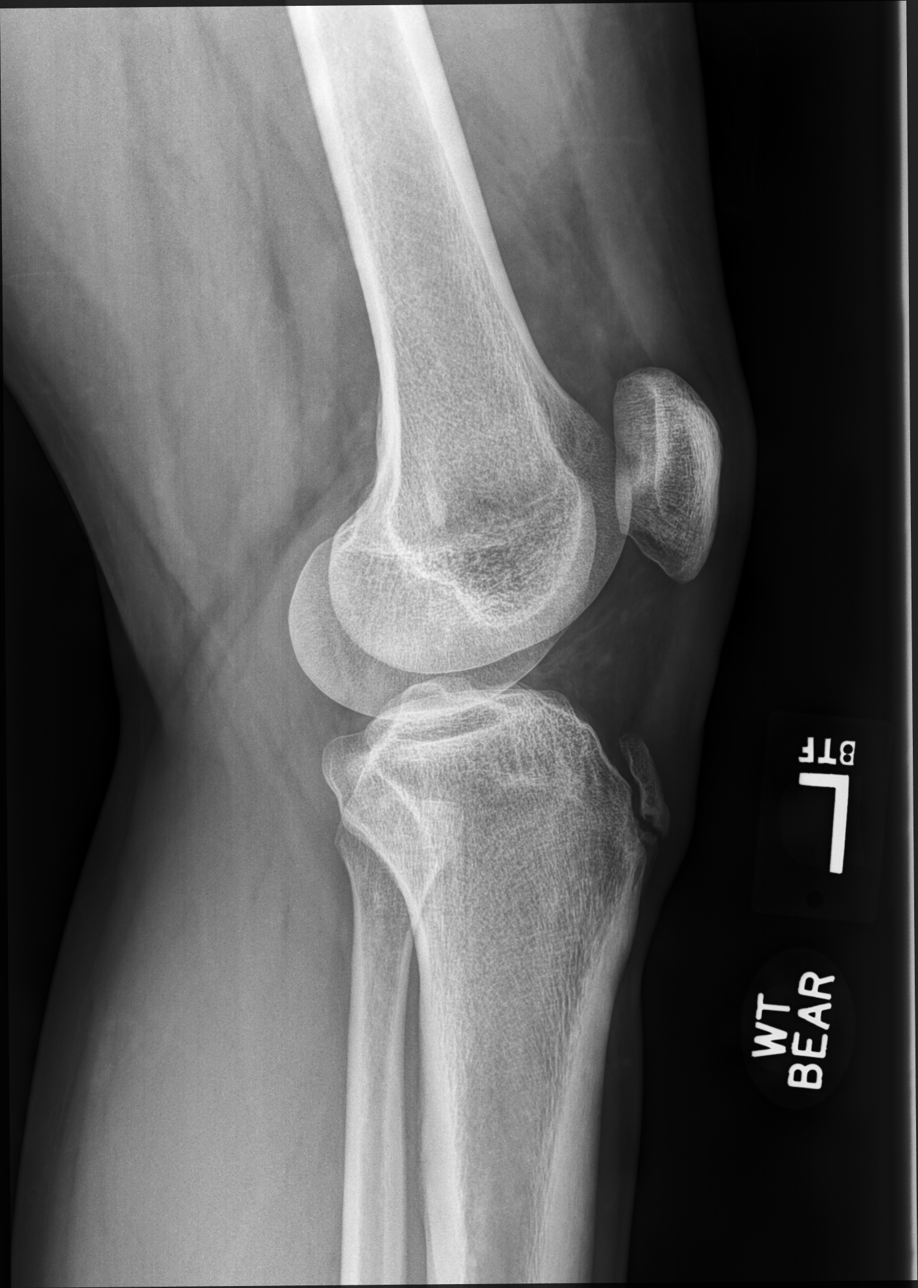

[humerus lat]
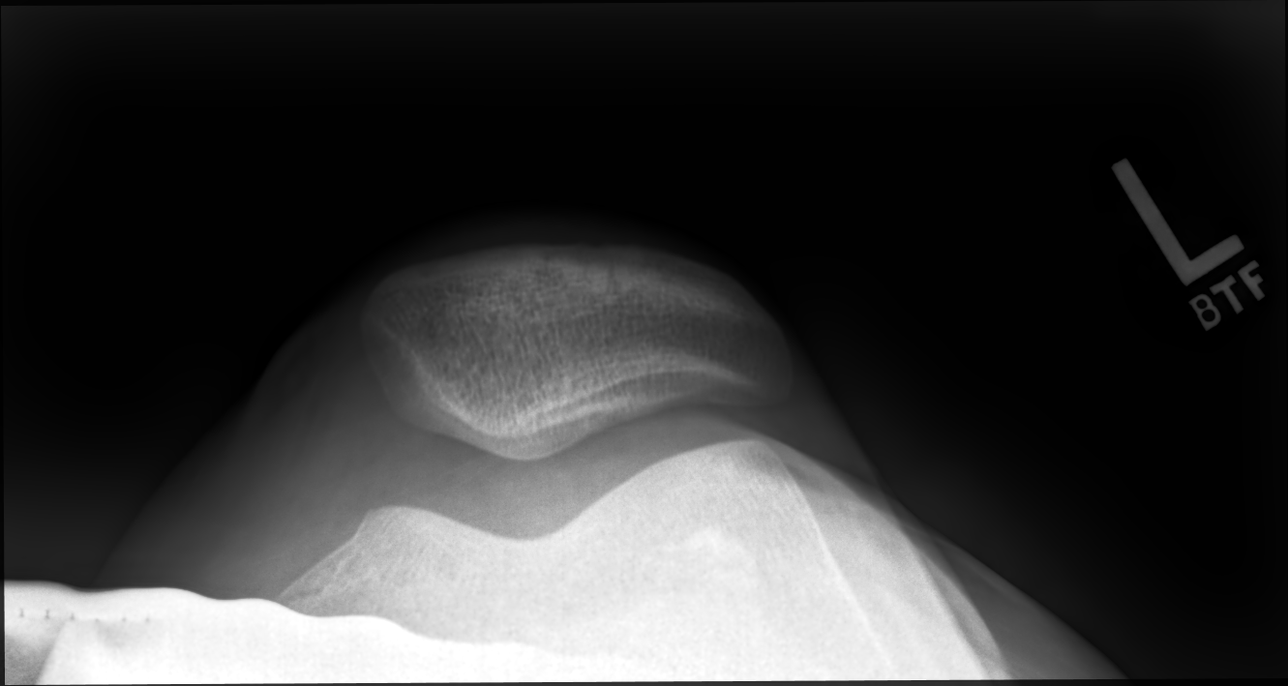

[patella lat]
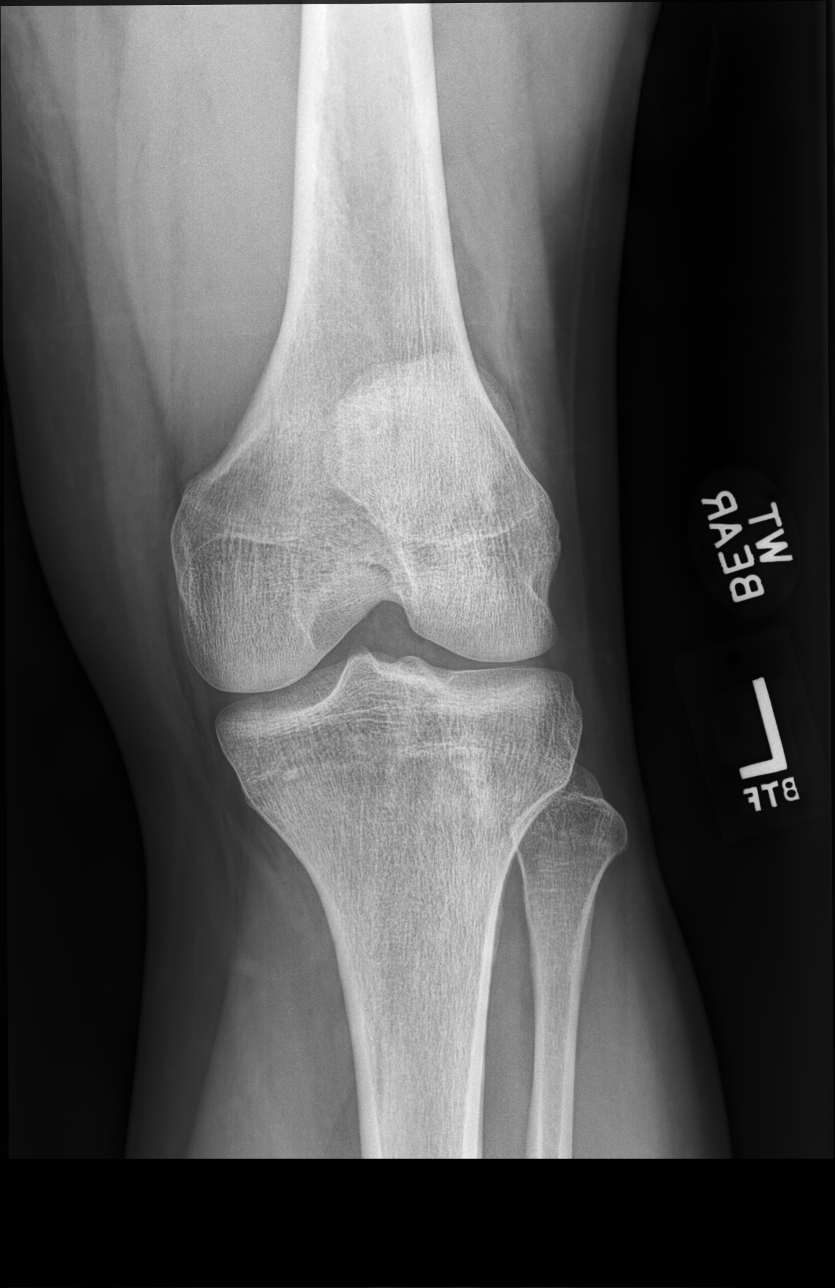

[4 of 4 positions shown; findings below may reference images not displayed]

FINDINGS: Standing AP view both knees, standing lateral, standing tunnel view
and sunrise views submitted. The alignment and joint spaces are
maintained. Well-defined osseous density adjacent to the anterior
tibial tubercle with minimal soft tissue prominence. No fracture,
bony destructive change or periosteal reaction. No focal bone
lesion. No joint effusion.
IMPRESSION: Well-defined osseous density adjacent to the anterior tibial
tubercle consistent with history of Osgood-Schlatter. There is
minimal associated soft tissue prominence that can be seen with
acute inflammation.

Otherwise unremarkable radiographs of the left knee.

## 2019-05-17 ENCOUNTER — Ambulatory Visit: Payer: BC Managed Care – PPO | Admitting: Physician Assistant

## 2019-05-17 ENCOUNTER — Encounter: Payer: Self-pay | Admitting: Physician Assistant

## 2019-05-17 ENCOUNTER — Other Ambulatory Visit: Payer: Self-pay

## 2019-05-17 VITALS — BP 120/82 | HR 87 | Temp 97.5°F | Resp 14 | Ht 68.0 in | Wt 218.0 lb

## 2019-05-17 DIAGNOSIS — K5790 Diverticulosis of intestine, part unspecified, without perforation or abscess without bleeding: Secondary | ICD-10-CM

## 2019-05-17 LAB — CBC WITH DIFFERENTIAL/PLATELET
Basophils Absolute: 0 10*3/uL (ref 0.0–0.1)
Basophils Relative: 0.3 % (ref 0.0–3.0)
Eosinophils Absolute: 0.1 10*3/uL (ref 0.0–0.7)
Eosinophils Relative: 2.6 % (ref 0.0–5.0)
HCT: 44.9 % (ref 39.0–52.0)
Hemoglobin: 15.2 g/dL (ref 13.0–17.0)
Lymphocytes Relative: 29 % (ref 12.0–46.0)
Lymphs Abs: 1.5 10*3/uL (ref 0.7–4.0)
MCHC: 33.9 g/dL (ref 30.0–36.0)
MCV: 93.3 fl (ref 78.0–100.0)
Monocytes Absolute: 0.3 10*3/uL (ref 0.1–1.0)
Monocytes Relative: 5.9 % (ref 3.0–12.0)
Neutro Abs: 3.3 10*3/uL (ref 1.4–7.7)
Neutrophils Relative %: 62.2 % (ref 43.0–77.0)
Platelets: 190 10*3/uL (ref 150.0–400.0)
RBC: 4.8 Mil/uL (ref 4.22–5.81)
RDW: 12.9 % (ref 11.5–15.5)
WBC: 5.3 10*3/uL (ref 4.0–10.5)

## 2019-05-17 MED ORDER — AMOXICILLIN-POT CLAVULANATE 875-125 MG PO TABS: 1.0000 | ORAL_TABLET | Freq: Two times a day (BID) | ORAL | 0 refills | Status: DC

## 2019-05-17 NOTE — Progress Notes (Signed)
Patient presents to clinic today c/o abdominal pain x 1 week, located in LLQ, from umbilicus to left groin region. Describes it as constant pressure, aggravated when he wears a belt. Does not radiate into back or leg. Associated with increased frequency of bowel movements, more loose than normal. Denies melena, blood in stool. Denies fever, chills, nausea, vomiting, urinary frequency, dysuria, pain with defecation. No trauma, no change in diet or contaminated water. No history of abdominal surgery.  Denies history of similar symptoms.   Past Medical History:  Diagnosis Date  . Ringing in ears    Both ears, started in 2015    Current Outpatient Medications on File Prior to Visit  Medication Sig Dispense Refill  . Turmeric 400 MG CAPS Take 1 capsule by mouth daily.    Marland Kitchen albuterol (VENTOLIN HFA) 108 (90 Base) MCG/ACT inhaler Inhale 2 puffs into the lungs every 6 (six) hours as needed for wheezing or shortness of breath. (Patient not taking: Reported on 05/17/2019) 1 Inhaler 0  . Omega-3 Fatty Acids (FISH OIL) 1000 MG CAPS Take 1 capsule by mouth daily.     No current facility-administered medications on file prior to visit.     No Known Allergies  Family History  Problem Relation Age of Onset  . High blood pressure Mother   . High blood pressure Father   . Heart disease Paternal Uncle   . Diabetes Paternal Grandmother   . Breast cancer Maternal Grandmother     Social History   Socioeconomic History  . Marital status: Married    Spouse name: Not on file  . Number of children: Not on file  . Years of education: Not on file  . Highest education level: Not on file  Occupational History  . Not on file  Social Needs  . Financial resource strain: Not on file  . Food insecurity    Worry: Not on file    Inability: Not on file  . Transportation needs    Medical: Not on file    Non-medical: Not on file  Tobacco Use  . Smoking status: Never Smoker  . Smokeless tobacco: Never Used   Substance and Sexual Activity  . Alcohol use: Yes    Comment: Maybe about 3 beers per month  . Drug use: No  . Sexual activity: Yes    Birth control/protection: None  Lifestyle  . Physical activity    Days per week: Not on file    Minutes per session: Not on file  . Stress: Not on file  Relationships  . Social Herbalist on phone: Not on file    Gets together: Not on file    Attends religious service: Not on file    Active member of club or organization: Not on file    Attends meetings of clubs or organizations: Not on file    Relationship status: Not on file  Other Topics Concern  . Not on file  Social History Narrative  . Not on file   Review of Systems - See HPI.  All other ROS are negative.  BP 120/82   Pulse 87   Temp (!) 97.5 F (36.4 C) (Temporal)   Resp 14   Ht 5\' 8"  (1.727 m)   Wt 218 lb (98.9 kg)   SpO2 98%   BMI 33.15 kg/m   Physical Exam Vitals signs reviewed.  Constitutional:      Appearance: He is well-developed.  HENT:     Head:  Normocephalic and atraumatic.  Cardiovascular:     Rate and Rhythm: Normal rate and regular rhythm.     Heart sounds: Normal heart sounds.  Pulmonary:     Effort: Pulmonary effort is normal.  Abdominal:     General: Abdomen is flat. Bowel sounds are normal.     Palpations: Abdomen is soft.     Tenderness: There is abdominal tenderness in the left lower quadrant. There is no right CVA tenderness, left CVA tenderness, guarding or rebound. Negative signs include McBurney's sign.     Hernia: No hernia is present.  Neurological:     Mental Status: He is alert.  Psychiatric:        Mood and Affect: Mood normal.      Assessment/Plan: 1. Diverticular disease Focal LLQ tenderness with pressure sensation and mild bowel changes. No alarm signs or symptoms. Concern for mild diverticular inflammation versus mild colitis. Will start Augmenting BID x 7 days. Dietary recommendations reviewed. CBC obtained today. Strict  ER and return precautions reviewed with patient.   - amoxicillin-clavulanate (AUGMENTIN) 875-125 MG tablet; Take 1 tablet by mouth 2 (two) times daily.  Dispense: 14 tablet; Refill: 0 - CBC w/Diff   Leeanne Rio, PA-C

## 2019-05-17 NOTE — Progress Notes (Deleted)
Abdominal pain x 1 week, located in LLQ, from umbilicus to left groin region. Describes it as constant pressure, aggravated when he wears a belt. Does not radiate into back or leg. Associated with increased frequency of bowel movements, more loose than normal. Denies melena, blood in stool. Denies fever, chills, nausea, vomiting, urinary frequency, dysuria, pain with defecation.   No trauma, no change in diet or contaminated water. No history of abdominal surgery.

## 2019-05-17 NOTE — Patient Instructions (Signed)
Please keep hydrated and get plenty of rest. Take the antibiotic as directed. Consider starting a daily probiotic OTC.   Please let us know ASAP of any new or worsening symptoms or if symptoms are not improving in the next 48 hours.    Diverticulitis  Diverticulitis is when small pockets in your large intestine (colon) get infected or swollen. This causes stomach pain and watery poop (diarrhea). These pouches are called diverticula. They form in people who have a condition called diverticulosis. Follow these instructions at home: Medicines  Take over-the-counter and prescription medicines only as told by your doctor. These include: ? Antibiotics. ? Pain medicines. ? Fiber pills. ? Probiotics. ? Stool softeners.  Do not drive or use heavy machinery while taking prescription pain medicine.  If you were prescribed an antibiotic, take it as told. Do not stop taking it even if you feel better. General instructions   Follow a diet as told by your doctor.  When you feel better, your doctor may tell you to change your diet. You may need to eat a lot of fiber. Fiber makes it easier to poop (have bowel movements). Healthy foods with fiber include: ? Berries. ? Beans. ? Lentils. ? Green vegetables.  Exercise 3 or more times a week. Aim for 30 minutes each time. Exercise enough to sweat and make your heart beat faster.  Keep all follow-up visits as told. This is important. You may need to have an exam of the large intestine. This is called a colonoscopy. Contact a doctor if:  Your pain does not get better.  You have a hard time eating or drinking.  You are not pooping like normal. Get help right away if:  Your pain gets worse.  Your problems do not get better.  Your problems get worse very fast.  You have a fever.  You throw up (vomit) more than one time.  You have poop that is: ? Bloody. ? Black. ? Tarry. Summary  Diverticulitis is when small pockets in your large  intestine (colon) get infected or swollen.  Take medicines only as told by your doctor.  Follow a diet as told by your doctor. This information is not intended to replace advice given to you by your health care provider. Make sure you discuss any questions you have with your health care provider. Document Released: 12/25/2007 Document Revised: 06/20/2017 Document Reviewed: 07/25/2016 Elsevier Patient Education  2020 [employer].

## 2019-05-20 ENCOUNTER — Encounter: Payer: Self-pay | Admitting: Emergency Medicine

## 2019-06-01 ENCOUNTER — Other Ambulatory Visit: Payer: Self-pay | Admitting: Emergency Medicine

## 2019-06-01 ENCOUNTER — Encounter: Payer: Self-pay | Admitting: Physician Assistant

## 2019-06-01 ENCOUNTER — Ambulatory Visit (INDEPENDENT_AMBULATORY_CARE_PROVIDER_SITE_OTHER): Payer: BC Managed Care – PPO | Admitting: Physician Assistant

## 2019-06-01 ENCOUNTER — Other Ambulatory Visit: Payer: Self-pay

## 2019-06-01 VITALS — BP 118/82 | HR 72 | Temp 98.0°F | Resp 16 | Ht 68.75 in | Wt 214.0 lb

## 2019-06-01 DIAGNOSIS — R1032 Left lower quadrant pain: Secondary | ICD-10-CM | POA: Diagnosis not present

## 2019-06-01 DIAGNOSIS — E782 Mixed hyperlipidemia: Secondary | ICD-10-CM

## 2019-06-01 DIAGNOSIS — Z0000 Encounter for general adult medical examination without abnormal findings: Secondary | ICD-10-CM | POA: Diagnosis not present

## 2019-06-01 LAB — COMPREHENSIVE METABOLIC PANEL
ALT: 29 U/L (ref 0–53)
AST: 20 U/L (ref 0–37)
Albumin: 4.7 g/dL (ref 3.5–5.2)
Alkaline Phosphatase: 81 U/L (ref 39–117)
BUN: 13 mg/dL (ref 6–23)
CO2: 29 mEq/L (ref 19–32)
Calcium: 9.7 mg/dL (ref 8.4–10.5)
Chloride: 101 mEq/L (ref 96–112)
Creatinine, Ser: 0.97 mg/dL (ref 0.40–1.50)
GFR: 89.93 mL/min (ref >60.00)
Glucose, Bld: 99 mg/dL (ref 70–99)
Potassium: 4.4 mEq/L (ref 3.5–5.1)
Sodium: 137 mEq/L (ref 135–145)
Total Bilirubin: 1.2 mg/dL (ref 0.2–1.2)
Total Protein: 7 g/dL (ref 6.0–8.3)

## 2019-06-01 LAB — LIPID PANEL
Cholesterol: 230 mg/dL — ABNORMAL HIGH (ref 0–200)
HDL: 39.8 mg/dL (ref >39.00)
LDL Cholesterol: 159 mg/dL — ABNORMAL HIGH (ref 0–99)
NonHDL: 189.77
Total CHOL/HDL Ratio: 6
Triglycerides: 154 mg/dL — ABNORMAL HIGH (ref 0.0–149.0)
VLDL: 30.8 mg/dL (ref 0.0–40.0)

## 2019-06-01 LAB — CBC WITH DIFFERENTIAL/PLATELET
Basophils Absolute: 0 10*3/uL (ref 0.0–0.1)
Basophils Relative: 0.5 % (ref 0.0–3.0)
Eosinophils Absolute: 0.1 10*3/uL (ref 0.0–0.7)
Eosinophils Relative: 2.6 % (ref 0.0–5.0)
HCT: 44.2 % (ref 39.0–52.0)
Hemoglobin: 15 g/dL (ref 13.0–17.0)
Lymphocytes Relative: 32.4 % (ref 12.0–46.0)
Lymphs Abs: 1.7 10*3/uL (ref 0.7–4.0)
MCHC: 33.9 g/dL (ref 30.0–36.0)
MCV: 93.7 fl (ref 78.0–100.0)
Monocytes Absolute: 0.5 10*3/uL (ref 0.1–1.0)
Monocytes Relative: 9.1 % (ref 3.0–12.0)
Neutro Abs: 2.9 10*3/uL (ref 1.4–7.7)
Neutrophils Relative %: 55.4 % (ref 43.0–77.0)
Platelets: 198 10*3/uL (ref 150.0–400.0)
RBC: 4.72 Mil/uL (ref 4.22–5.81)
RDW: 13.4 % (ref 11.5–15.5)
WBC: 5.3 10*3/uL (ref 4.0–10.5)

## 2019-06-01 NOTE — Progress Notes (Signed)
Patient presents to clinic today for annual exam.  Patient is fasting for labs.  Acute Concerns: LLQ discomfort-- was moving some tools and noticed discomfort in the area later in the day, radiates to side. Associated with left-sided lower back pain.  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 Unchanged, no hearing loss  Health Maintenance: Immunizations -- influenza vaccine deferred.  Past Medical History:  Diagnosis Date  . Ringing in ears    Both ears, started in 2015    No past surgical history on file.  Current Outpatient Medications on File Prior to Visit  Medication Sig Dispense Refill  . albuterol (VENTOLIN HFA) 108 (90 Base) MCG/ACT inhaler Inhale 2 puffs into the lungs every 6 (six) hours as needed for wheezing or shortness of breath. (Patient not taking: Reported on 05/17/2019) 1 Inhaler 0  . amoxicillin-clavulanate (AUGMENTIN) 875-125 MG tablet Take 1 tablet by mouth 2 (two) times daily. 14 tablet 0  . Omega-3 Fatty Acids (FISH OIL) 1000 MG CAPS Take 1 capsule by mouth daily.    . Turmeric 400 MG CAPS Take 1 capsule by mouth daily.     No current facility-administered medications on file prior to visit.     No Known Allergies  Family History  Problem Relation Age of Onset  . High blood pressure Mother   . High blood pressure Father   . Heart disease Paternal Uncle   . Diabetes Paternal Grandmother   . Breast cancer Maternal Grandmother     Social History   Socioeconomic History  . Marital status: Married    Spouse name: Not on file  . Number of children: Not on file  . Years of education: Not on file  . Highest education level: Not on file  Occupational History  . Not on file  Social Needs  . Financial resource strain: Not on file  . Food insecurity    Worry: Not on file    Inability: Not on file  . Transportation needs    Medical: Not on file    Non-medical: Not on file  Tobacco Use  . Smoking status: Never Smoker  . Smokeless  tobacco: Never Used  Substance and Sexual Activity  . Alcohol use: Yes    Comment: Maybe about 3 beers per month  . Drug use: No  . Sexual activity: Yes    Birth control/protection: None  Lifestyle  . Physical activity    Days per week: Not on file    Minutes per session: Not on file  . Stress: Not on file  Relationships  . Social Herbalist on phone: Not on file    Gets together: Not on file    Attends religious service: Not on file    Active member of club or organization: Not on file    Attends meetings of clubs or organizations: Not on file    Relationship status: Not on file  . Intimate partner violence    Fear of current or ex partner: Not on file    Emotionally abused: Not on file    Physically abused: Not on file    Forced sexual activity: Not on file  Other Topics Concern  . Not on file  Social History Narrative  . Not on file   Review of Systems  Constitutional: Negative for chills, fever and weight loss.  HENT: Positive for tinnitus. Negative for hearing loss.   Eyes: Negative for blurred vision and double vision.  Respiratory: Negative for  cough and shortness of breath.   Cardiovascular: Negative for chest pain and palpitations.  Gastrointestinal: Positive for heartburn (infrequent). Negative for abdominal pain, blood in stool, constipation, diarrhea, melena, nausea and vomiting.  Genitourinary: Negative for dysuria, frequency and urgency.  Neurological: Negative for dizziness and headaches.  Psychiatric/Behavioral: Negative for depression. The patient is not nervous/anxious and does not have insomnia.    Ht 5' 8.75" (1.746 m)   Wt 214 lb (97.1 kg)   BMI 31.83 kg/m   Physical Exam Constitutional:      Appearance: Normal appearance.  HENT:     Head: Normocephalic and atraumatic.     Right Ear: Tympanic membrane, ear canal and external ear normal. There is no impacted cerumen.     Left Ear: Tympanic membrane, ear canal and external ear normal.  There is no impacted cerumen.  Eyes:     Conjunctiva/sclera: Conjunctivae normal.     Pupils: Pupils are equal, round, and reactive to light.  Neck:     Musculoskeletal: Neck supple.     Thyroid: No thyromegaly.  Cardiovascular:     Rate and Rhythm: Normal rate and regular rhythm.     Heart sounds: Normal heart sounds, S1 normal and S2 normal. No murmur.  Pulmonary:     Effort: Pulmonary effort is normal.     Breath sounds: Normal breath sounds. No wheezing.  Abdominal:     General: Abdomen is flat. Bowel sounds are normal. There is no distension.     Palpations: Abdomen is soft. There is no hepatomegaly, mass or pulsatile mass.     Tenderness: There is abdominal tenderness in the left lower quadrant. There is no guarding.     Hernia: No hernia is present.  Lymphadenopathy:     Cervical: No cervical adenopathy.  Skin:    General: Skin is warm and dry.  Neurological:     General: No focal deficit present.     Mental Status: He is alert.  Psychiatric:        Mood and Affect: Mood normal.        Thought Content: Thought content normal.     Recent Results (from the past 2160 hour(s))  CBC w/Diff     Status: None   Collection Time: 05/17/19 10:30 AM  Result Value Ref Range   WBC 5.3 4.0 - 10.5 K/uL   RBC 4.80 4.22 - 5.81 Mil/uL   Hemoglobin 15.2 13.0 - 17.0 g/dL   HCT 44.9 39.0 - 52.0 %   MCV 93.3 78.0 - 100.0 fl   MCHC 33.9 30.0 - 36.0 g/dL   RDW 12.9 11.5 - 15.5 %   Platelets 190.0 150.0 - 400.0 K/uL   Neutrophils Relative % 62.2 43.0 - 77.0 %   Lymphocytes Relative 29.0 12.0 - 46.0 %   Monocytes Relative 5.9 3.0 - 12.0 %   Eosinophils Relative 2.6 0.0 - 5.0 %   Basophils Relative 0.3 0.0 - 3.0 %   Neutro Abs 3.3 1.4 - 7.7 K/uL   Lymphs Abs 1.5 0.7 - 4.0 K/uL   Monocytes Absolute 0.3 0.1 - 1.0 K/uL   Eosinophils Absolute 0.1 0.0 - 0.7 K/uL   Basophils Absolute 0.0 0.0 - 0.1 K/uL   Assessment/Plan: 1. Visit for preventive health examination Depression screen  negative. Health Maintenance reviewed Preventive schedule discussed and handout given in AVS. Will obtain fasting labs today. Will obtain fasting labs today.  - CBC w/Diff - Comp Met (CMET) - Lipid Profile  2. LLQ pain LLQ  discomfort consistent with muscle strain, advised patient avoid heavy lifting or overexertion. Recommend heating pad, tylenol or ibuprofen as needed for pain and inflammation. Follow-up if symptoms persist or worsen.    Leeanne Rio, PA-C

## 2019-06-01 NOTE — Patient Instructions (Signed)
Please go to the lab for blood work.   Our office will call you with your results unless you have chosen to receive results via MyChart.  If your blood work is normal we will follow-up each year for physicals and as scheduled for chronic medical problems.  If anything is abnormal we will treat accordingly and get you in for a follow-up.  Please avoid heavy lifting or overexertion. Take Tylenol or Ibuprofen to help with pain and inflammation. Can consider a heating pad for 10-15 minutes a few times per day to the low back and side. Let me know if this is not resolving in the next few days.     Preventive Care 93-20 Years Old, Male Preventive care refers to lifestyle choices and visits with your health care provider that can promote health and wellness. This includes:  A yearly physical exam. This is also called an annual well check.  Regular dental and eye exams.  Immunizations.  Screening for certain conditions.  Healthy lifestyle choices, such as eating a healthy diet, getting regular exercise, not using drugs or products that contain nicotine and tobacco, and limiting alcohol use. What can I expect for my preventive care visit? Physical exam Your health care provider will check:  Height and weight. These may be used to calculate body mass index (BMI), which is a measurement that tells if you are at a healthy weight.  Heart rate and blood pressure.  Your skin for abnormal spots. Counseling Your health care provider may ask you questions about:  Alcohol, tobacco, and drug use.  Emotional well-being.  Home and relationship well-being.  Sexual activity.  Eating habits.  Work and work Statistician. What immunizations do I need?  Influenza (flu) vaccine  This is recommended every year. Tetanus, diphtheria, and pertussis (Tdap) vaccine  You may need a Td booster every 10 years. Varicella (chickenpox) vaccine  You may need this vaccine if you have not already been  vaccinated. Human papillomavirus (HPV) vaccine  If recommended by your health care provider, you may need three doses over 6 months. Measles, mumps, and rubella (MMR) vaccine  You may need at least one dose of MMR. You may also need a second dose. Meningococcal conjugate (MenACWY) vaccine  One dose is recommended if you are 73-24 years old and a Market researcher living in a residence hall, or if you have one of several medical conditions. You may also need additional booster doses. Pneumococcal conjugate (PCV13) vaccine  You may need this if you have certain conditions and were not previously vaccinated. Pneumococcal polysaccharide (PPSV23) vaccine  You may need one or two doses if you smoke cigarettes or if you have certain conditions. Hepatitis A vaccine  You may need this if you have certain conditions or if you travel or work in places where you may be exposed to hepatitis A. Hepatitis B vaccine  You may need this if you have certain conditions or if you travel or work in places where you may be exposed to hepatitis B. Haemophilus influenzae type b (Hib) vaccine  You may need this if you have certain risk factors. You may receive vaccines as individual doses or as more than one vaccine together in one shot (combination vaccines). Talk with your health care provider about the risks and benefits of combination vaccines. What tests do I need? Blood tests  Lipid and cholesterol levels. These may be checked every 5 years starting at age 31.  Hepatitis C test.  Hepatitis B test.  Screening   Diabetes screening. This is done by checking your blood sugar (glucose) after you have not eaten for a while (fasting).  Sexually transmitted disease (STD) testing. Talk with your health care provider about your test results, treatment options, and if necessary, the need for more tests. Follow these instructions at home: Eating and drinking   Eat a diet that includes fresh  fruits and vegetables, whole grains, lean protein, and low-fat dairy products.  Take vitamin and mineral supplements as recommended by your health care provider.  Do not drink alcohol if your health care provider tells you not to drink.  If you drink alcohol: ? Limit how much you have to 0-2 drinks a day. ? Be aware of how much alcohol is in your drink. In the [place] one drink equals one 12 oz bottle of beer (355 mL), one 5 oz glass of wine (148 mL), or one 1 oz glass of hard liquor (44 mL). Lifestyle  Take daily care of your teeth and gums.  Stay active. Exercise for at least 30 minutes on 5 or more days each week.  Do not use any products that contain nicotine or tobacco, such as cigarettes, e-cigarettes, and chewing tobacco. If you need help quitting, ask your health care provider.  If you are sexually active, practice safe sex. Use a condom or other form of protection to prevent STIs (sexually transmitted infections). What's next?  Go to your health care provider once a year for a well check visit.  Ask your health care provider how often you should have your eyes and teeth checked.  Stay up to date on all vaccines. This information is not intended to replace advice given to you by your health care provider. Make sure you discuss any questions you have with your health care provider. Document Released: 09/03/2001 Document Revised: 07/02/2018 Document Reviewed: 07/02/2018 Elsevier Patient Education  2020 [employer].

## 2019-07-22 ENCOUNTER — Encounter (HOSPITAL_COMMUNITY): Payer: Self-pay | Admitting: *Deleted

## 2019-07-22 ENCOUNTER — Other Ambulatory Visit: Payer: Self-pay

## 2019-07-22 ENCOUNTER — Emergency Department (HOSPITAL_COMMUNITY)
Admission: EM | Admit: 2019-07-22 | Discharge: 2019-07-22 | Disposition: A | Discharge status: Home / Self Care | Payer: BC Managed Care – PPO | Attending: Emergency Medicine | Admitting: Emergency Medicine

## 2019-07-22 ENCOUNTER — Emergency Department (HOSPITAL_COMMUNITY): Payer: BC Managed Care – PPO

## 2019-07-22 DIAGNOSIS — R0789 Other chest pain: Secondary | ICD-10-CM | POA: Insufficient documentation

## 2019-07-22 LAB — CBC
HCT: 45.5 % (ref 39.0–52.0)
Hemoglobin: 15.6 g/dL (ref 13.0–17.0)
MCH: 31.9 pg (ref 26.0–34.0)
MCHC: 34.3 g/dL (ref 30.0–36.0)
MCV: 93 fL (ref 80.0–100.0)
Platelets: 221 10*3/uL (ref 150–400)
RBC: 4.89 MIL/uL (ref 4.22–5.81)
RDW: 12 % (ref 11.5–15.5)
WBC: 5.6 10*3/uL (ref 4.0–10.5)
nRBC: 0 % (ref 0.0–0.2)

## 2019-07-22 LAB — BASIC METABOLIC PANEL
Anion gap: 11 (ref 5–15)
BUN: 17 mg/dL (ref 6–20)
CO2: 26 mmol/L (ref 22–32)
Calcium: 9.7 mg/dL (ref 8.9–10.3)
Chloride: 101 mmol/L (ref 98–111)
Creatinine, Ser: 1.09 mg/dL (ref 0.61–1.24)
GFR calc Af Amer: >60 mL/min (ref >60)
GFR calc non Af Amer: >60 mL/min (ref >60)
Glucose, Bld: 105 mg/dL — ABNORMAL HIGH (ref 70–99)
Potassium: 4.6 mmol/L (ref 3.5–5.1)
Sodium: 138 mmol/L (ref 135–145)

## 2019-07-22 LAB — TROPONIN I (HIGH SENSITIVITY)
Troponin I (High Sensitivity): <2 ng/L (ref <18)
Troponin I (High Sensitivity): 3 ng/L (ref <18)

## 2019-07-22 MED ORDER — SODIUM CHLORIDE 0.9% FLUSH
3.0000 mL | Freq: Once | INTRAVENOUS | Status: AC
  Administered 2019-07-22: 3 mL via INTRAVENOUS

## 2019-07-22 MED ORDER — NAPROXEN 500 MG PO TABS: 500.0000 mg | ORAL_TABLET | Freq: Two times a day (BID) | ORAL | 0 refills | Status: DC

## 2019-07-22 NOTE — Discharge Instructions (Addendum)
Take medication as needed for pain.  Follow-up with your primary care doctor if not improving in the next week.  Return as needed for worsening symptoms

## 2019-07-22 NOTE — ED Provider Notes (Signed)
[facility] EMERGENCY DEPARTMENT Provider Note   CSN: [id number] Arrival date & time: 07/22/19  [id number]     History Chief Complaint  Patient presents with  . Chest Pain    Jesse Shaw is a 33 y.o. male.  HPI  HPI: A 33 year old patient presents for evaluation of chest pain. Initial onset of pain was approximately 1-3 hours ago. The patient's chest pain is sharp and is not worse with exertion. The patient's chest pain is middle- or left-sided, is not well-localized, is not described as heaviness/pressure/tightness and does radiate to the arms/jaw/neck. The patient does not complain of nausea and denies diaphoresis. The patient has no history of stroke, has no history of peripheral artery disease, has not smoked in the past 90 days, denies any history of treated diabetes, has no relevant family history of coronary artery disease (first degree relative at less than age 20), is not hypertensive, has no history of hypercholesterolemia and does not have an elevated BMI (>=30).    Pain is sharp.  Increases with exhalation.  NO leg swelling.  NO recent trips, travel.  No hx pe.  Past Medical History:  Diagnosis Date  . Ringing in ears    Both ears, started in 2015    Patient Active Problem List   Diagnosis Date Noted  . Hx of Osgood-Schlatter disease 09/25/2017  . Chronic pain of left knee 09/25/2017    History reviewed. No pertinent surgical history.     Family History  Problem Relation Age of Onset  . High blood pressure Mother   . High blood pressure Father   . Heart disease Paternal Uncle   . Diabetes Paternal Grandmother   . Breast cancer Maternal Grandmother     Social History   Tobacco Use  . Smoking status: Never Smoker  . Smokeless tobacco: Never Used  Substance Use Topics  . Alcohol use: Yes    Comment: Maybe about 3 beers per month  . Drug use: No    Home Medications Prior to Admission medications   Medication Sig Start Date End Date  Taking? Authorizing Provider  naproxen (NAPROSYN) 500 MG tablet Take 1 tablet (500 mg total) by mouth 2 (two) times daily with a meal. As needed for pain 07/22/19   Dorie Rank, MD    Allergies    Patient has no known allergies.  Review of Systems   Review of Systems  Constitutional: Negative for fever.  Respiratory: Negative for cough and shortness of breath.   Gastrointestinal: Negative for abdominal pain.  Musculoskeletal: Negative for myalgias.  All other systems reviewed and are negative.   Physical Exam Updated Vital Signs BP 120/74   Pulse 65   Temp (!) 97.4 F (36.3 C) (Oral)   Resp 16   Ht 1.727 m (5\' 8" )   Wt 95.3 kg   SpO2 96%   BMI 31.93 kg/m   Physical Exam Vitals and nursing note reviewed.  Constitutional:      General: He is not in acute distress.    Appearance: He is well-developed.  HENT:     Head: Normocephalic and atraumatic.     Right Ear: External ear normal.     Left Ear: External ear normal.  Eyes:     General: No scleral icterus.       Right eye: No discharge.        Left eye: No discharge.     Conjunctiva/sclera: Conjunctivae normal.  Neck:     Trachea: No tracheal  deviation.  Cardiovascular:     Rate and Rhythm: Normal rate and regular rhythm.  Pulmonary:     Effort: Pulmonary effort is normal. No respiratory distress.     Breath sounds: Normal breath sounds. No stridor. No wheezing or rales.  Abdominal:     General: Bowel sounds are normal. There is no distension.     Palpations: Abdomen is soft.     Tenderness: There is no abdominal tenderness. There is no guarding or rebound.  Musculoskeletal:        General: No tenderness.     Cervical back: Neck supple.  Skin:    General: Skin is warm and dry.     Findings: No rash.  Neurological:     Mental Status: He is alert.     Cranial Nerves: No cranial nerve deficit (no facial droop, extraocular movements intact, no slurred speech).     Sensory: No sensory deficit.     Motor: No  abnormal muscle tone or seizure activity.     Coordination: Coordination normal.     ED Results / Procedures / Treatments   Labs (all labs ordered are listed, but only abnormal results are displayed) Labs Reviewed  BASIC METABOLIC PANEL - Abnormal; Notable for the following components:      Result Value   Glucose, Bld 105 (*)    All other components within normal limits  CBC  TROPONIN I (HIGH SENSITIVITY)  TROPONIN I (HIGH SENSITIVITY)    EKG EKG Interpretation  Date/Time:  Thursday July 22 2019 06:17:33 EST Ventricular Rate:  69 PR Interval:  146 QRS Duration: 84 QT Interval:  380 QTC Calculation: 407 R Axis:   30 Text Interpretation: Normal sinus rhythm No old tracing to compare Confirmed by Dorie Rank [phone]) on 07/22/2019 7:00:32 AM   Radiology DG Chest 2 View  Result Date: 07/22/2019 CLINICAL DATA:  Chest pain on the left for several hours, initial encounter EXAM: CHEST - 2 VIEW COMPARISON:  None. FINDINGS: The heart size and mediastinal contours are within normal limits. Both lungs are clear. The visualized skeletal structures are unremarkable. IMPRESSION: No active cardiopulmonary disease. Electronically Signed   By: Inez Catalina M.D.   On: 07/22/2019 07:24    Procedures Procedures (including critical care time)  Medications Ordered in ED Medications  sodium chloride flush (NS) 0.9 % injection 3 mL (3 mLs Intravenous Given 07/22/19 [id number])    ED Course  I have reviewed the triage vital signs and the nursing notes.  Pertinent labs & imaging results that were available during my care of the patient were reviewed by me and considered in my medical decision making (see chart for details).    MDM Rules/Calculators/A&P HEAR Score: 0                    ED work-up reassuring.  Symptoms are atypical for ACS.  Low risk heart score.  Serial troponins are negative.  I doubt symptoms are related to acute coronary syndrome.  Patient is low risk for PE.  PERC  negative.  Chest x-ray without pneumonia.  Symptoms may be related to pleurisy.  Will DC home with NSAIDs.  Outpatient follow-up. Final Clinical Impression(s) / ED Diagnoses Final diagnoses:  Atypical chest pain    Rx / DC Orders ED Discharge Orders         Ordered    naproxen (NAPROSYN) 500 MG tablet  2 times daily with meals     07/22/19 1037  Dorie Rank, MD 07/22/19 1040

## 2019-07-22 NOTE — ED Triage Notes (Signed)
Chest pain since 0500am today sl sob history of the same  Did not see a doctor  No pain at present

## 2019-08-31 ENCOUNTER — Ambulatory Visit (HOSPITAL_BASED_OUTPATIENT_CLINIC_OR_DEPARTMENT_OTHER)
Admission: RE | Admit: 2019-08-31 | Discharge: 2019-08-31 | Disposition: A | Discharge status: Home / Self Care | Payer: BC Managed Care – PPO | Source: Ambulatory Visit | Attending: Physician Assistant | Admitting: Physician Assistant

## 2019-08-31 ENCOUNTER — Encounter: Payer: Self-pay | Admitting: Physician Assistant

## 2019-08-31 ENCOUNTER — Other Ambulatory Visit: Payer: Self-pay

## 2019-08-31 ENCOUNTER — Ambulatory Visit: Payer: BC Managed Care – PPO | Admitting: Physician Assistant

## 2019-08-31 VITALS — BP 120/82 | HR 75 | Temp 98.6°F | Resp 16 | Ht 68.0 in | Wt 221.0 lb

## 2019-08-31 DIAGNOSIS — M546 Pain in thoracic spine: Secondary | ICD-10-CM | POA: Insufficient documentation

## 2019-08-31 DIAGNOSIS — R222 Localized swelling, mass and lump, trunk: Secondary | ICD-10-CM | POA: Insufficient documentation

## 2019-08-31 MED ORDER — MELOXICAM 15 MG PO TABS: 15.0000 mg | ORAL_TABLET | Freq: Every day | ORAL | 0 refills | Status: DC

## 2019-08-31 NOTE — Patient Instructions (Signed)
Please avoid heavy lifting and overexertion when possible.  Take the Meloxicam once daily with food over the next week. Consider applying a heating pad to the affected area for 10-15 minutes a couple of times per day to see if this helps soothe the area.  On exam I do feel a small area of fullness which could be cystic lesion, etc that is a little deeper. Do not feel anything hard or non-mobile which would be concerning for something more serious but I am proceeding with an Ultrasound of the area to further assess.   Please let me know if you note any new or worsening symptoms.  I will call you once we get [place] results and discuss next steps.   Hang in there!

## 2019-08-31 NOTE — Progress Notes (Signed)
Patient presents to clinic today c/o 2 weeks of discomfort in the left, lateral thoracic back.  Patient denies any trauma or injury prior to symptom onset.  Denies any noted heavy lifting or overexertion.  Pain is intermittent, mainly present when he applies pressure to that area such as laying down in bed at night or leaning back in a chair.  Notes when he does this he will feel a sharp pain in the area, usually around 4 out of 10 but sometimes more significant.  Pain alleviates a short while after pressure is removed from the area.  Patient denies any noted skin changes in the area.  Denies any noted hyperesthesia or allodynia in the area.  Has never had this issue before.  Notes patient denies taking anything for his symptoms or applying anything topically to  back   Past Medical History:  Diagnosis Date  . Ringing in ears    Both ears, started in 2015    No current outpatient medications on file prior to visit.   No current facility-administered medications on file prior to visit.    No Known Allergies  Family History  Problem Relation Age of Onset  . High blood pressure Mother   . High blood pressure Father   . Heart disease Paternal Uncle   . Diabetes Paternal Grandmother   . Breast cancer Maternal Grandmother     Social History   Socioeconomic History  . Marital status: Married    Spouse name: Not on file  . Number of children: Not on file  . Years of education: Not on file  . Highest education level: Not on file  Occupational History  . Not on file  Tobacco Use  . Smoking status: Never Smoker  . Smokeless tobacco: Never Used  Substance and Sexual Activity  . Alcohol use: Yes    Comment: Maybe about 3 beers per month  . Drug use: No  . Sexual activity: Yes    Birth control/protection: None  Other Topics Concern  . Not on file  Social History Narrative  . Not on file   Social Determinants of Health   Financial Resource Strain:   . Difficulty of Paying  Living Expenses: Not on file  Food Insecurity:   . Worried About Charity fundraiser in the Last Year: Not on file  . Ran Out of Food in the Last Year: Not on file  Transportation Needs:   . Lack of Transportation (Medical): Not on file  . Lack of Transportation (Non-Medical): Not on file  Physical Activity:   . Days of Exercise per Week: Not on file  . Minutes of Exercise per Session: Not on file  Stress:   . Feeling of Stress : Not on file  Social Connections:   . Frequency of Communication with Friends and Family: Not on file  . Frequency of Social Gatherings with Friends and Family: Not on file  . Attends Religious Services: Not on file  . Active Member of Clubs or Organizations: Not on file  . Attends Archivist Meetings: Not on file  . Marital Status: Not on file   Review of Systems - See HPI.  All other ROS are negative.  BP 120/82   Pulse 75   Temp 98.6 F (37 C) (Temporal)   Resp 16   Ht 5\' 8"  (1.727 m)   Wt 221 lb (100.2 kg)   SpO2 98%   BMI 33.60 kg/m   Physical Exam Vitals reviewed.  Constitutional:      Appearance: Normal appearance.  HENT:     Head: Normocephalic and atraumatic.  Cardiovascular:     Rate and Rhythm: Normal rate and regular rhythm.     Heart sounds: Normal heart sounds.  Pulmonary:     [place]: Pulmonary effort is normal.     Breath sounds: Normal breath sounds.  Chest:     Chest wall: No tenderness.  Musculoskeletal:     Cervical back: Neck supple.       Back:  Neurological:     Mental Status: He is alert.     Recent Results (from the past 2160 hour(s))  Basic metabolic panel     Status: Abnormal   Collection Time: 07/22/19  6:41 AM  Result Value Ref Range   Sodium 138 135 - 145 mmol/L   Potassium 4.6 3.5 - 5.1 mmol/L   Chloride 101 98 - 111 mmol/L   CO2 26 22 - 32 mmol/L   Glucose, Bld 105 (H) 70 - 99 mg/dL   BUN 17 6 - 20 mg/dL   Creatinine, Ser 1.09 0.61 - 1.24 mg/dL   Calcium 9.7 8.9 - 10.3 mg/dL   GFR  calc non Af Amer >60 >60 mL/min   GFR calc Af Amer >60 >60 mL/min   Anion gap 11 5 - 15    Comment: Performed at [facility] [address] [place] [address]  CBC     Status: None   Collection Time: 07/22/19  6:41 AM  Result Value Ref Range   WBC 5.6 4.0 - 10.5 K/uL   RBC 4.89 4.22 - 5.81 MIL/uL   Hemoglobin 15.6 13.0 - 17.0 g/dL   HCT 45.5 39.0 - 52.0 %   MCV 93.0 80.0 - 100.0 fL   MCH 31.9 26.0 - 34.0 pg   MCHC 34.3 30.0 - 36.0 g/dL   RDW 12.0 11.5 - 15.5 %   Platelets 221 150 - 400 K/uL   nRBC 0.0 0.0 - 0.2 %    Comment: Performed at [facility], [place] [address], [place], [place] [address]  Troponin I (High Sensitivity)     Status: None   Collection Time: 07/22/19  6:41 AM  Result Value Ref Range   Troponin I (High Sensitivity) <2 <18 ng/L    Comment: Performed at [facility] [address], [place], [place] [address]  Troponin I (High Sensitivity)     Status: None   Collection Time: 07/22/19  9:15 AM  Result Value Ref Range   Troponin I (High Sensitivity) 3 <18 ng/L    Comment: (NOTE) Elevated high sensitivity troponin I (hsTnI) values and significant  changes across serial measurements may suggest ACS but many other  chronic and acute conditions are known to elevate hsTnI results.  Refer to the "Links" section for chest pain algorithms and additional  guidance. Performed at [facility], [place] [address], [place], [place] [address]     Assessment/Plan: 1. Acute left-sided thoracic back pain 2. Subcutaneous mass of back X2 weeks.  Atraumatic.  No spinal tenderness or abnormality.  There is tenderness to palpation of left lateral thoracic area with palpable 2 cm x 3 cm area overlying lower lateral rib.  This is the site of tenderness.  Recommend he avoid any heavy lifting or overexertion.  Can consider heating pad to the area just to alleviate any tenderness when present.  Rx meloxicam to help with any inflammation in the  area.  We will  proceed with ultrasound to further characterize.  Will alter management according to results.  - [place] MiscellaneoUS Localization; Future  This visit occurred during the SARS-CoV-2 public health emergency.  Safety protocols were in place, including screening questions prior to the visit, additional usage of staff PPE, and extensive cleaning of exam room while observing appropriate contact time as indicated for disinfecting solutions.     Leeanne Rio, PA-C

## 2019-09-03 ENCOUNTER — Other Ambulatory Visit: Payer: Self-pay | Admitting: Physician Assistant

## 2019-09-03 ENCOUNTER — Encounter: Payer: Self-pay | Admitting: Physician Assistant

## 2019-09-06 ENCOUNTER — Other Ambulatory Visit: Payer: Self-pay | Admitting: Physician Assistant

## 2019-09-06 DIAGNOSIS — R1900 Intra-abdominal and pelvic swelling, mass and lump, unspecified site: Secondary | ICD-10-CM

## 2019-09-08 ENCOUNTER — Encounter: Payer: Self-pay | Admitting: Emergency Medicine

## 2020-12-04 ENCOUNTER — Ambulatory Visit: Payer: BC Managed Care – PPO | Admitting: Family Medicine

## 2020-12-22 ENCOUNTER — Encounter: Payer: Self-pay | Admitting: Registered Nurse

## 2020-12-22 ENCOUNTER — Other Ambulatory Visit (HOSPITAL_COMMUNITY)
Admission: RE | Admit: 2020-12-22 | Discharge: 2020-12-22 | Disposition: A | Discharge status: Home / Self Care | Payer: 59 | Source: Ambulatory Visit | Attending: Registered Nurse | Admitting: Registered Nurse

## 2020-12-22 ENCOUNTER — Other Ambulatory Visit: Payer: Self-pay

## 2020-12-22 ENCOUNTER — Ambulatory Visit: Payer: 59 | Admitting: Registered Nurse

## 2020-12-22 VITALS — BP 107/63 | HR 74 | Temp 98.2°F | Resp 18 | Ht 68.0 in | Wt 213.4 lb

## 2020-12-22 DIAGNOSIS — R350 Frequency of micturition: Secondary | ICD-10-CM | POA: Insufficient documentation

## 2020-12-22 DIAGNOSIS — N451 Epididymitis: Secondary | ICD-10-CM

## 2020-12-22 LAB — POCT URINALYSIS DIP (MANUAL ENTRY)
Bilirubin, UA: NEGATIVE
Blood, UA: NEGATIVE
Glucose, UA: NEGATIVE mg/dL
Ketones, POC UA: NEGATIVE mg/dL
Nitrite, UA: NEGATIVE
Spec Grav, UA: 1.02 (ref 1.010–1.025)
Urobilinogen, UA: 0.2 E.U./dL
pH, UA: 5 (ref 5.0–8.0)

## 2020-12-22 MED ORDER — DOXYCYCLINE HYCLATE 100 MG PO TABS: 100.0000 mg | ORAL_TABLET | Freq: Two times a day (BID) | ORAL | 0 refills | Status: DC

## 2020-12-22 MED ORDER — CEFTRIAXONE SODIUM 1 G IJ SOLR
1.0000 g | Freq: Once | INTRAMUSCULAR | Status: AC
  Administered 2020-12-22: 1 g via INTRAMUSCULAR

## 2020-12-22 NOTE — Patient Instructions (Addendum)
Jesse Shaw -  I think this will be an easy course to treat. Likely just some bacteria at the wrong place at the wrong time. 1g Rocephin today followed by doxycycline 100mg  by mouth twice daily for ten days. Let me know if you have trouble with this medication as it can cause upset stomach  This kind of infection does not implicate sexual transmission but we will test for gonorrhea, chlamydia, and trichomonas to cover our bases - mostly me wanting to do my due diligence - and send out a urine culture to check for UTI  If symptoms persist or recur let me know  Thank you  Rich     If you have lab work done today you will be contacted with your lab results within the next 2 weeks.  If you have not heard from [place] then please contact us. The fastest way to get your results is to register for My Chart.   IF you received an x-ray today, you will receive an invoice from [facility]. Please contact [facility] at [phone] with questions or concerns regarding your invoice.   IF you received labwork today, you will receive an invoice from [facility]. Please contact [facility] at [phone] with questions or concerns regarding your invoice.   Our billing staff will not be able to assist you with questions regarding bills from these companies.  You will be contacted with the lab results as soon as they are available. The fastest way to get your results is to activate your My Chart account. Instructions are located on the last page of this paperwork. If you have not heard from [place] regarding the results in 2 weeks, please contact this office.      Epididymitis  Epididymitis is swelling (inflammation) or infection of the epididymis. The epididymis is a cord-like structure that is located along the top and back part of the testicle. It collects and stores sperm from the testicle. This condition can also cause pain and swelling of the testicle and scrotum. Symptoms usually start  suddenly (acute epididymitis). Sometimes epididymitis starts gradually and lasts for a while (chronic epididymitis). This type may be harder to treat. What are the causes? In men ages 70-40, this condition is usually caused by a bacterial infection or a sexually transmitted disease (STD), such as:  Gonorrhea.  Chlamydia. In men 26 and older who do not have anal sex, this condition is usually caused by bacteria from a blockage or from abnormalities in the urinary system. These can result from:  Having a tube placed into the bladder (urinary catheter).  Having an enlarged or inflamed prostate gland.  Having recently had urinary tract surgery.  Having a problem with a backward flow of urine (retrograde). In men who have a condition that weakens the body's defense system (immune system), such as HIV, this condition can be caused by:  Other bacteria, including tuberculosis and syphilis.  Viruses.  Fungi. Sometimes this condition occurs without infection. This may happen because of trauma or repetitive activities such as sports. What increases the risk? You are more likely to develop this condition if you have:  Unprotected sex with more than one partner.  Anal sex.  Recently had surgery.  A urinary catheter.  Urinary problems.  A suppressed immune system. What are the signs or symptoms? This condition usually begins suddenly with chills, fever, and pain behind the scrotum and in the testicle. Other symptoms include:  Swelling of the scrotum, testicle, or both.  Pain when  ejaculating or urinating.  Pain in the back or abdomen.  Nausea.  Itching and discharge from the penis.  A frequent need to pass urine.  Redness, increased warmth, and tenderness of the scrotum. How is this diagnosed? Your health care provider can diagnose this condition based on your symptoms and medical history. Your health care provider will also do a physical exam to ask about your symptoms and  check your scrotum and testicle for swelling, pain, and redness. You may also have other tests, including:  Examination of discharge from the penis.  Urine tests for infections, such as STDs.  Ultrasound test for blood flow and inflammation. Your health care provider may test you for other STDs, including HIV. How is this treated? Treatment for this condition depends on the cause. If your condition is caused by a bacterial infection, oral antibiotic medicine may be prescribed. If the bacterial infection has spread to your blood, you may need to receive IV antibiotics. For both bacterial and nonbacterial epididymitis, you may be treated with:  Rest.  Elevation of the scrotum.  Pain medicines.  Anti-inflammatory medicines. Surgery may be needed to treat:  Bacterial epididymitis that causes pus to build up in the scrotum (abscess).  Chronic epididymitis that has not responded to other treatments. Follow these instructions at home: Medicines  Take over-the-counter and prescription medicines only as told by your health care provider.  If you were prescribed an antibiotic medicine, take it as told by your health care provider. Do not stop taking the antibiotic even if your condition improves. Sexual activity  If your epididymitis was caused by an STD, avoid sexual activity until your treatment is complete.  Inform your sexual partner or partners if you test positive for an STD. They may need to be treated. Do not engage in sexual activity with your partner or partners until their treatment is completed. Managing pain and swelling  If directed, elevate your scrotum and apply ice. ? Put ice in a plastic bag. ? Place a small towel or pillow between your legs. ? Rest your scrotum on the pillow or towel. ? Place another towel between your skin and the plastic bag. ? Leave the ice on for 20 minutes, 2-3 times a day.  Try taking a sitz bath to help with discomfort. This is a warm water  bath that is taken while you are sitting down. The water should only come up to your hips and should cover your buttocks. Do this 3-4 times per day or as told by your health care provider.  Keep your scrotum elevated and supported while resting. Ask your health care provider if you should wear a scrotal support, such as a jockstrap. Wear it as told by your health care provider.   General instructions  Return to your normal activities as told by your health care provider. Ask your health care provider what activities are safe for you.  Drink enough fluid to keep your urine pale yellow.  Keep all follow-up visits as told by your health care provider. This is important. Contact a health care provider if:  You have a fever.  Your pain medicine is not helping.  Your pain is getting worse.  Your symptoms do not improve within 3 days. Summary  Epididymitis is swelling (inflammation) or infection of the epididymis. This condition can also cause pain and swelling of the testicle and scrotum.  Treatment for this condition depends on the cause. If your condition is caused by a bacterial infection, oral  antibiotic medicine may be prescribed.  Inform your sexual partner or partners if you test positive for an STD. They may need to be treated. Do not engage in sexual activity with your partner or partners until their treatment is completed.  Contact a health care provider if your symptoms do not improve within 3 days. This information is not intended to replace advice given to you by your health care provider. Make sure you discuss any questions you have with your health care provider. Document Revised: 05/11/2018 Document Reviewed: 05/12/2018 Elsevier Patient Education  2021 [employer].

## 2020-12-22 NOTE — Progress Notes (Signed)
Acute Office Visit  Subjective:    Patient ID: Jesse Shaw, male    DOB: 29-Sep-1985, 35 y.o.   MRN: [id number]  Chief Complaint  Patient presents with  . Urinary Tract Infection    Patient states he has been feeling some frequent urination and pain durin intercourse for 2 weeks.    HPI Patient is in today for urinary frequency and pain during intercourse Onset 2 weeks ago Stable, waxing and waning No discharge Posterior testicular pain and swelling Pain with ejaculation No hematuria or hematospermia No fevers, chills, fatigue, or other systemic symptoms   Past Medical History:  Diagnosis Date  . Ringing in ears    Both ears, started in 2015    No past surgical history on file.  Family History  Problem Relation Age of Onset  . High blood pressure Mother   . High blood pressure Father   . Heart disease Paternal Uncle   . Diabetes Paternal Grandmother   . Breast cancer Maternal Grandmother     Social History   Socioeconomic History  . Marital status: Married    Spouse name: Not on file  . Number of children: Not on file  . Years of education: Not on file  . Highest education level: Not on file  Occupational History  . Not on file  Tobacco Use  . Smoking status: Never Smoker  . Smokeless tobacco: Never Used  Vaping Use  . Vaping Use: Never used  Substance and Sexual Activity  . Alcohol use: Yes    Comment: Maybe about 3 beers per month  . Drug use: No  . Sexual activity: Yes    Birth control/protection: None  Other Topics Concern  . Not on file  Social History Narrative  . Not on file   Social Determinants of Health   Financial Resource Strain: Not on file  Food Insecurity: Not on file  Transportation Needs: Not on file  Physical Activity: Not on file  Stress: Not on file  Social Connections: Not on file  Intimate Partner Violence: Not on file    Outpatient Medications Prior to Visit  Medication Sig Dispense Refill  . meloxicam (MOBIC) 15  MG tablet Take 1 tablet (15 mg total) by mouth daily. (Patient not taking: Reported on 12/22/2020) 15 tablet 0   No facility-administered medications prior to visit.    No Known Allergies  Review of Systems  Constitutional: Negative.   HENT: Negative.   Eyes: Negative.   Respiratory: Negative.   Cardiovascular: Negative.   Gastrointestinal: Negative.   Genitourinary: Positive for penile pain and testicular pain.  Musculoskeletal: Negative.   Skin: Negative.   Neurological: Negative.   Psychiatric/Behavioral: Negative.   All other systems reviewed and are negative.      Objective:    Physical Exam Vitals and nursing note reviewed.  Constitutional:      Appearance: Normal appearance.  Cardiovascular:     Rate and Rhythm: Normal rate and regular rhythm.     Pulses: Normal pulses.     Heart sounds: Normal heart sounds. No murmur heard. No friction rub. No gallop.   Pulmonary:     [place]: Pulmonary effort is normal. No respiratory distress.     Breath sounds: Normal breath sounds. No stridor. No wheezing, rhonchi or rales.  Genitourinary:    Testes:        Right: Tenderness (posterior) present.  Neurological:     General: No focal deficit present.     Mental Status: He  is alert. Mental status is at baseline.  Psychiatric:        Mood and Affect: Mood normal.        Behavior: Behavior normal.        Thought Content: Thought content normal.        Judgment: Judgment normal.     BP 107/63   Pulse 74   Temp 98.2 F (36.8 C) (Temporal)   Resp 18   Ht 5\' 8"  (1.727 m)   Wt 213 lb 6.4 oz (96.8 kg)   SpO2 99%   BMI 32.45 kg/m  Wt Readings from Last 3 Encounters:  12/22/20 213 lb 6.4 oz (96.8 kg)  08/31/19 221 lb (100.2 kg)  07/22/19 210 lb (95.3 kg)    There are no preventive care reminders to display for this patient.  There are no preventive care reminders to display for this patient.   No results found for: TSH Lab Results  Component Value Date   WBC 5.6  07/22/2019   HGB 15.6 07/22/2019   HCT 45.5 07/22/2019   MCV 93.0 07/22/2019   PLT 221 07/22/2019   Lab Results  Component Value Date   NA 138 07/22/2019   K 4.6 07/22/2019   CO2 26 07/22/2019   GLUCOSE 105 (H) 07/22/2019   BUN 17 07/22/2019   CREATININE 1.09 07/22/2019   BILITOT 1.2 06/01/2019   ALKPHOS 81 06/01/2019   AST 20 06/01/2019   ALT 29 06/01/2019   PROT 7.0 06/01/2019   ALBUMIN 4.7 06/01/2019   CALCIUM 9.7 07/22/2019   ANIONGAP 11 07/22/2019   GFR 89.93 06/01/2019   Lab Results  Component Value Date   CHOL 230 (H) 06/01/2019   Lab Results  Component Value Date   HDL 39.80 06/01/2019   Lab Results  Component Value Date   LDLCALC 159 (H) 06/01/2019   Lab Results  Component Value Date   TRIG 154.0 (H) 06/01/2019   Lab Results  Component Value Date   CHOLHDL 6 06/01/2019   No results found for: HGBA1C     Assessment & Plan:   Problem List Items Addressed This Visit   None   Visit Diagnoses    Frequent urination    -  Primary   Relevant Orders   POCT urinalysis dipstick       No orders of the defined types were placed in this encounter.  PLAN  Localized testicular pain suggestive of epididymitis. Will treat as such with IM rocephin and course of doxycycline.  Send out culture and STI screen, low suspicion for STI as he is in a monogamous marriage.  Follow up based on lab results, if symptoms persist or recur can return to office or can consider urology ref  Patient encouraged to call clinic with any questions, comments, or concerns.   Maximiano Coss, NP

## 2020-12-24 LAB — URINE CULTURE
MICRO NUMBER:: 11967677
Result:: NO GROWTH
SPECIMEN QUALITY:: ADEQUATE

## 2020-12-25 LAB — URINE CYTOLOGY ANCILLARY ONLY
Chlamydia: NEGATIVE
Comment: NEGATIVE
Comment: NEGATIVE
Comment: NORMAL
Neisseria Gonorrhea: NEGATIVE
Trichomonas: NEGATIVE

## 2021-02-06 ENCOUNTER — Encounter: Payer: BC Managed Care – PPO | Admitting: Registered Nurse

## 2023-06-30 ENCOUNTER — Ambulatory Visit (INDEPENDENT_AMBULATORY_CARE_PROVIDER_SITE_OTHER): Payer: Self-pay

## 2023-06-30 ENCOUNTER — Ambulatory Visit (INDEPENDENT_AMBULATORY_CARE_PROVIDER_SITE_OTHER): Payer: Self-pay | Admitting: Sports Medicine

## 2023-06-30 VITALS — HR 86 | Ht 68.0 in | Wt 227.0 lb

## 2023-06-30 DIAGNOSIS — M25561 Pain in right knee: Secondary | ICD-10-CM

## 2023-06-30 MED ORDER — MELOXICAM 15 MG PO TABS: 15.0000 mg | ORAL_TABLET | Freq: Every day | ORAL | 0 refills | Status: AC

## 2023-06-30 NOTE — Patient Instructions (Addendum)
-   Start meloxicam 15 mg daily x2 weeks.  If still having pain after 2 weeks, complete 3rd-week of NSAID. May use remaining NSAID as needed once daily for pain control.  Do not to use additional over-the-counter NSAIDs (ibuprofen, naproxen, Advil, Aleve) while taking prescription NSAIDs.  May use Tylenol [phone] mg 2 to 3 times a day for breakthrough pain. Knee HEP  Work note provided no lifting more than 20 pound, no kneeling for longer than 5 minutes, no climbing ladders until 07/21/2023 3 week follow up

## 2023-06-30 NOTE — Progress Notes (Signed)
Jesse Shaw D.[facility] [address] [place] [address] Phone: [phone]   Assessment and Plan:     1. Acute pain of right knee -Subacute, initial visit - 6 weeks of lateral right knee pain.  Consistent with mild lateral meniscal pathology such as meniscal contusion versus fraying occurring during physical activity jujitsu, though no evidence of large tear based on physical exam or HPI.  - Start meloxicam 15 mg daily x2 weeks.  If still having pain after 2 weeks, complete 3rd-week of NSAID. May use remaining NSAID as needed once daily for pain control.  Do not to use additional over-the-counter NSAIDs (ibuprofen, naproxen, Advil, Aleve) while taking prescription NSAIDs.  May use Tylenol [phone] mg 2 to 3 times a day for breakthrough pain. - Start HEP for knee - Recommend decreasing activity that could reflare knee pain.  This would include lifting <20 pounds, not climbing ladders, limiting bending on knee to no more than 5 minutes at a time.  Work note provided.  If these restrictions cannot be met, would recommend out of work for 3 weeks or until reevaluated. -X-rays in clinic.  My interpretation: No acute fracture or dislocation.  Small cortical density in medial anterior knee that does not correlate with area of pain  15 additional minutes spent for educating Therapeutic Home Exercise Program.  This included exercises focusing on stretching, strengthening, with focus on eccentric aspects.   Long term goals include an improvement in range of motion, strength, endurance as well as avoiding reinjury. Patient's frequency would include in 1-2 times a day, 3-5 times a week for a duration of 6-12 weeks. Proper technique shown and discussed handout in great detail with ATC.  All questions were discussed and answered.    Pertinent previous records reviewed include none  Follow Up: 3 weeks for reevaluation.  If no improvement or worsening of symptoms,  could consider physical therapy versus CSI versus advanced imaging   Subjective:   I, Jesse Shaw, am serving as a Neurosurgeon for Doctor Richardean Sale  Chief Complaint: right knee pain   HPI:   06/30/23 Patient is a 37 year old male with right knee pain. Patient states he had knee injury a month ago. He was training jui jitsu. Lateral knee pain. He works in [employer]. Pain has gotten better but not 100%. No radiating pain. Occasional ibu and tylenol. No numbness or tingling. Does endorse soreness when getting out of bed   Relevant Historical Information: None pertinent  Additional pertinent review of systems negative.   Current Outpatient Medications:    meloxicam (MOBIC) 15 MG tablet, Take 1 tablet (15 mg total) by mouth daily., Disp: 30 tablet, Rfl: 0   doxycycline (VIBRA-TABS) 100 MG tablet, Take 1 tablet (100 mg total) by mouth 2 (two) times daily., Disp: 20 tablet, Rfl: 0   meloxicam (MOBIC) 15 MG tablet, Take 1 tablet (15 mg total) by mouth daily. (Patient not taking: Reported on 12/22/2020), Disp: 15 tablet, Rfl: 0   Objective:     Vitals:   06/30/23 0829  Pulse: 86  SpO2: 96%  Weight: 227 lb (103 kg)  Height: 5\' 8"  (1.727 m)      Body mass index is 34.52 kg/m.    Physical Exam:    General:  awake, alert oriented, no acute distress nontoxic Skin: no suspicious lesions or rashes Neuro:sensation intact and strength 5/5 with no deficits, no atrophy, normal muscle tone Psych: No signs of anxiety, depression or  other mood disorder  Right knee: No swelling No deformity Neg fluid wave, joint milking ROM Flex 110, Ext 0 NTTP over the quad tendon, medial fem condyle, lat fem condyle, patella, plica, patella tendon, tibial tuberostiy, fibular head, posterior fossa, pes anserine bursa, gerdy's tubercle, medial jt line, lateral jt line Neg anterior and posterior drawer Neg lachman Neg sag sign Negative varus stress Negative valgus stress Negative McMurray Positive  Thessaly for lateral knee pain Pain over anterior lateral knee with single-leg squat Gait normal    Electronically signed by:  Jesse Shaw D.[facility] 9:32 AM 06/30/23

## 2023-07-14 NOTE — Progress Notes (Signed)
    Ben Kindred Reidinger D.[facility] [address] [place] [address] Phone: [phone]   Assessment and Plan:     1. Acute pain of right knee - Subacute, improving, subsequent visit - 10 weeks of right lateral knee pain overall improving after course of meloxicam , HEP, work restrictions.  Most consistent with mild lateral meniscal pathology such as meniscal contusion versus fraying that likely occurred during physical activity jujitsu - Patient may return to work without restrictions - Discontinue meloxicam  15 mg daily.  Start meloxicam  15 mg daily as needed for breakthrough pain.  Recommend limiting chronic NSAIDs to 1-2 doses per week maximum - Recommend Tylenol for day-to-day pain relief - Continue HEP - Offered physical therapy, CSI at today's visit which were declined, though could be considered at future visit.   Pertinent previous records reviewed include none  Follow Up: As needed.  If pain returns, patient could call and ask for MRI and we can follow-up 5 days after MRI to review results and create treatment plan   Subjective:   I, Chestine Reeves, am serving as a neurosurgeon for Doctor Morene Mace   Chief Complaint: right knee pain    HPI:    06/30/23 Patient is a 37 year old male with right knee pain. Patient states he had knee injury a month ago. He was training jui jitsu. Lateral knee pain. He works in [employer]. Pain has gotten better but not 100%. No radiating pain. Occasional ibu and tylenol. No numbness or tingling. Does endorse soreness when getting out of bed   07/24/2023 Patient states he still has some pain.   Relevant Historical Information: None pertinent  Additional pertinent review of systems negative.   Current Outpatient Medications:    meloxicam  (MOBIC ) 15 MG tablet, Take 1 tablet (15 mg total) by mouth daily., Disp: 30 tablet, Rfl: 0   Objective:     Vitals:   07/24/23 0858  BP: 134/86  Pulse: 86  SpO2: 97%   Weight: 228 lb (103.4 kg)  Height: 5' 8 (1.727 m)      Body mass index is 34.67 kg/m.    Physical Exam:    General:  awake, alert oriented, no acute distress nontoxic Skin: no suspicious lesions or rashes Neuro:sensation intact and strength 5/5 with no deficits, no atrophy, normal muscle tone Psych: No signs of anxiety, depression or other mood disorder   Right knee: No swelling No deformity Neg fluid wave, joint milking ROM Flex 110, Ext 0 NTTP over the quad tendon, medial fem condyle, lat fem condyle, patella, plica, patella tendon, tibial tuberostiy, fibular head, posterior fossa, pes anserine bursa, gerdy's tubercle, medial jt line, lateral jt line Neg anterior and posterior drawer Neg lachman Neg sag sign Negative varus stress Negative valgus stress Negative McMurray Positive Thessaly for lateral knee pain Pain over anterior lateral knee with single-leg squat Gait normal     Electronically signed by:  Odis Mace D.[facility] 9:11 AM 07/24/23

## 2023-07-24 ENCOUNTER — Ambulatory Visit: Payer: Self-pay | Admitting: Sports Medicine

## 2023-07-24 VITALS — BP 134/86 | HR 86 | Ht 68.0 in | Wt 228.0 lb

## 2023-07-24 DIAGNOSIS — M25561 Pain in right knee: Secondary | ICD-10-CM

## 2023-07-24 NOTE — Patient Instructions (Signed)
 Discontinue meloxicam use remainder as needed limiting to 1-2 doses per week  Tylenol for day to day pain relief  No restriction for work  If pain returns call and ask for MRI  As needed follow up

## 2023-07-27 ENCOUNTER — Other Ambulatory Visit: Payer: Self-pay | Admitting: Sports Medicine
